# Patient Record
Sex: Male | Born: 1970 | Race: White | Hispanic: No | Marital: Married | Smoking: Never smoker
Health system: Southern US, Community
[De-identification: ages and names within clinical notes are randomized; demographics above are authoritative.]

## PROBLEM LIST (undated history)

## (undated) ENCOUNTER — Ambulatory Visit: Disposition: A | Discharge status: Home / Self Care | Payer: Self-pay

## (undated) DIAGNOSIS — I82409 Acute embolism and thrombosis of unspecified deep veins of unspecified lower extremity: Secondary | ICD-10-CM

## (undated) DIAGNOSIS — R112 Nausea with vomiting, unspecified: Secondary | ICD-10-CM

## (undated) DIAGNOSIS — Z9889 Other specified postprocedural states: Secondary | ICD-10-CM

## (undated) DIAGNOSIS — M1990 Unspecified osteoarthritis, unspecified site: Secondary | ICD-10-CM

## (undated) HISTORY — PX: SHOULDER ARTHROSCOPY: SHX128

## (undated) HISTORY — PX: ROTATOR CUFF REPAIR: SHX139

## (undated) HISTORY — PX: KNEE ARTHROSCOPY: SUR90

## (undated) HISTORY — PX: TYMPANOSTOMY TUBE PLACEMENT: SHX32

---

## 2015-08-11 ENCOUNTER — Ambulatory Visit (HOSPITAL_COMMUNITY): Payer: BLUE CROSS/BLUE SHIELD | Admitting: Anesthesiology

## 2015-08-11 ENCOUNTER — Encounter (HOSPITAL_COMMUNITY)
Admission: AD | Disposition: A | Discharge status: Home Health | Payer: Self-pay | Source: Ambulatory Visit | Attending: Orthopedic Surgery

## 2015-08-11 ENCOUNTER — Inpatient Hospital Stay (HOSPITAL_COMMUNITY)
Admission: AD | Admit: 2015-08-11 | Discharge: 2015-08-16 | DRG: 486 | Disposition: A | Discharge status: Home Health | Payer: BLUE CROSS/BLUE SHIELD | Source: Ambulatory Visit | Attending: Orthopedic Surgery | Admitting: Orthopedic Surgery

## 2015-08-11 ENCOUNTER — Encounter (HOSPITAL_COMMUNITY): Payer: Self-pay | Admitting: General Practice

## 2015-08-11 ENCOUNTER — Other Ambulatory Visit: Payer: Self-pay | Admitting: Orthopedic Surgery

## 2015-08-11 DIAGNOSIS — M25512 Pain in left shoulder: Secondary | ICD-10-CM

## 2015-08-11 DIAGNOSIS — B9561 Methicillin susceptible Staphylococcus aureus infection as the cause of diseases classified elsewhere: Secondary | ICD-10-CM | POA: Diagnosis present

## 2015-08-11 DIAGNOSIS — M25562 Pain in left knee: Secondary | ICD-10-CM | POA: Diagnosis present

## 2015-08-11 DIAGNOSIS — M00062 Staphylococcal arthritis, left knee: Secondary | ICD-10-CM | POA: Diagnosis present

## 2015-08-11 DIAGNOSIS — M659 Synovitis and tenosynovitis, unspecified: Secondary | ICD-10-CM | POA: Diagnosis present

## 2015-08-11 DIAGNOSIS — Z9889 Other specified postprocedural states: Secondary | ICD-10-CM

## 2015-08-11 DIAGNOSIS — M00012 Staphylococcal arthritis, left shoulder: Secondary | ICD-10-CM | POA: Diagnosis present

## 2015-08-11 DIAGNOSIS — M00069 Staphylococcal arthritis, unspecified knee: Secondary | ICD-10-CM | POA: Diagnosis not present

## 2015-08-11 DIAGNOSIS — R7881 Bacteremia: Secondary | ICD-10-CM | POA: Diagnosis not present

## 2015-08-11 DIAGNOSIS — Z959 Presence of cardiac and vascular implant and graft, unspecified: Secondary | ICD-10-CM | POA: Diagnosis not present

## 2015-08-11 DIAGNOSIS — M009 Pyogenic arthritis, unspecified: Secondary | ICD-10-CM | POA: Diagnosis present

## 2015-08-11 HISTORY — PX: FINE NEEDLE ASPIRATION: SHX6590

## 2015-08-11 HISTORY — PX: KNEE ARTHROSCOPY: SHX127

## 2015-08-11 HISTORY — DX: Other specified postprocedural states: R11.2

## 2015-08-11 HISTORY — DX: Other specified postprocedural states: Z98.890

## 2015-08-11 LAB — CBC WITH DIFFERENTIAL/PLATELET
Basophils Absolute: 0 10*3/uL (ref 0.0–0.1)
Basophils Relative: 0 %
EOS PCT: 0 %
Eosinophils Absolute: 0 10*3/uL (ref 0.0–0.7)
HCT: 41.6 % (ref 39.0–52.0)
Hemoglobin: 13.9 g/dL (ref 13.0–17.0)
LYMPHS ABS: 0.7 10*3/uL (ref 0.7–4.0)
LYMPHS PCT: 5 %
MCH: 31.2 pg (ref 26.0–34.0)
MCHC: 33.4 g/dL (ref 30.0–36.0)
MCV: 93.5 fL (ref 78.0–100.0)
Monocytes Absolute: 1.1 10*3/uL — ABNORMAL HIGH (ref 0.1–1.0)
Monocytes Relative: 7 %
NEUTROS ABS: 12.7 10*3/uL — AB (ref 1.7–7.7)
NEUTROS PCT: 88 %
PLATELETS: 490 10*3/uL — AB (ref 150–400)
RBC: 4.45 MIL/uL (ref 4.22–5.81)
RDW: 13 % (ref 11.5–15.5)
WBC: 14.5 10*3/uL — AB (ref 4.0–10.5)

## 2015-08-11 LAB — COMPREHENSIVE METABOLIC PANEL
ALK PHOS: 207 U/L — AB (ref 38–126)
ALT: 68 U/L — AB (ref 17–63)
AST: 53 U/L — ABNORMAL HIGH (ref 15–41)
Albumin: 2.1 g/dL — ABNORMAL LOW (ref 3.5–5.0)
Anion gap: 13 (ref 5–15)
BILIRUBIN TOTAL: 2 mg/dL — AB (ref 0.3–1.2)
BUN: 15 mg/dL (ref 6–20)
CALCIUM: 8.7 mg/dL — AB (ref 8.9–10.3)
CHLORIDE: 97 mmol/L — AB (ref 101–111)
CO2: 27 mmol/L (ref 22–32)
CREATININE: 0.89 mg/dL (ref 0.61–1.24)
Glucose, Bld: 123 mg/dL — ABNORMAL HIGH (ref 65–99)
Potassium: 4.1 mmol/L (ref 3.5–5.1)
Sodium: 137 mmol/L (ref 135–145)
TOTAL PROTEIN: 7.4 g/dL (ref 6.5–8.1)

## 2015-08-11 LAB — SYNOVIAL CELL COUNT + DIFF, W/ CRYSTALS
Crystals, Fluid: NONE SEEN
Eosinophils-Synovial: 0 % (ref 0–1)
Lymphocytes-Synovial Fld: 1 % (ref 0–20)
Monocyte-Macrophage-Synovial Fluid: 1 % — ABNORMAL LOW (ref 50–90)
Neutrophil, Synovial: 98 % — ABNORMAL HIGH (ref 0–25)
WBC, Synovial: 13500 /mm3 — ABNORMAL HIGH (ref 0–200)

## 2015-08-11 LAB — PROTIME-INR
INR: 1.28 (ref 0.00–1.49)
PROTHROMBIN TIME: 16.1 s — AB (ref 11.6–15.2)

## 2015-08-11 LAB — APTT: aPTT: 29 seconds (ref 24–37)

## 2015-08-11 SURGERY — ARTHROSCOPY, KNEE
Anesthesia: General | Site: Shoulder | Laterality: Left

## 2015-08-11 MED ORDER — OXYCODONE HCL 5 MG PO TABS
5.0000 mg | ORAL_TABLET | ORAL | Status: DC | PRN
  Administered 2015-08-11 – 2015-08-16 (×25): 10 mg via ORAL
  Filled 2015-08-11 (×25): qty 2

## 2015-08-11 MED ORDER — DIAZEPAM 5 MG PO TABS
5.0000 mg | ORAL_TABLET | Freq: Four times a day (QID) | ORAL | Status: DC | PRN
  Administered 2015-08-12 – 2015-08-16 (×11): 5 mg via ORAL
  Filled 2015-08-11 (×11): qty 1

## 2015-08-11 MED ORDER — HYDROMORPHONE HCL 1 MG/ML IJ SOLN
INTRAMUSCULAR | Status: AC
  Filled 2015-08-11: qty 1

## 2015-08-11 MED ORDER — ONDANSETRON HCL 4 MG/2ML IJ SOLN
INTRAMUSCULAR | Status: DC | PRN
  Administered 2015-08-11: 4 mg via INTRAVENOUS

## 2015-08-11 MED ORDER — LACTATED RINGERS IV SOLN
INTRAVENOUS | Status: DC
  Administered 2015-08-11 (×2): via INTRAVENOUS

## 2015-08-11 MED ORDER — CEFAZOLIN SODIUM-DEXTROSE 2-3 GM-% IV SOLR
2.0000 g | INTRAVENOUS | Status: AC
  Administered 2015-08-11: 2 g via INTRAVENOUS

## 2015-08-11 MED ORDER — ACETAMINOPHEN 325 MG PO TABS
650.0000 mg | ORAL_TABLET | Freq: Four times a day (QID) | ORAL | Status: DC | PRN
  Administered 2015-08-13 – 2015-08-16 (×6): 650 mg via ORAL
  Filled 2015-08-11 (×7): qty 2

## 2015-08-11 MED ORDER — FENTANYL CITRATE (PF) 250 MCG/5ML IJ SOLN
INTRAMUSCULAR | Status: AC
  Filled 2015-08-11: qty 5

## 2015-08-11 MED ORDER — PROPOFOL 10 MG/ML IV BOLUS
INTRAVENOUS | Status: AC
  Filled 2015-08-11: qty 20

## 2015-08-11 MED ORDER — METOCLOPRAMIDE HCL 5 MG PO TABS: 5.0000 mg | ORAL_TABLET | Freq: Three times a day (TID) | ORAL | Status: DC | PRN

## 2015-08-11 MED ORDER — POLYETHYLENE GLYCOL 3350 17 G PO PACK: 17.0000 g | PACK | Freq: Every day | ORAL | Status: DC | PRN

## 2015-08-11 MED ORDER — LACTATED RINGERS IV SOLN
INTRAVENOUS | Status: DC
  Administered 2015-08-11: 18:00:00 via INTRAVENOUS

## 2015-08-11 MED ORDER — MIDAZOLAM HCL 2 MG/2ML IJ SOLN
INTRAMUSCULAR | Status: AC
  Filled 2015-08-11: qty 4

## 2015-08-11 MED ORDER — LIDOCAINE HCL (CARDIAC) 20 MG/ML IV SOLN
INTRAVENOUS | Status: DC | PRN
  Administered 2015-08-11: 60 mg via INTRAVENOUS

## 2015-08-11 MED ORDER — CEFAZOLIN SODIUM-DEXTROSE 2-3 GM-% IV SOLR
INTRAVENOUS | Status: AC
  Filled 2015-08-11: qty 50

## 2015-08-11 MED ORDER — CEFAZOLIN SODIUM-DEXTROSE 2-3 GM-% IV SOLR
2.0000 g | Freq: Four times a day (QID) | INTRAVENOUS | Status: DC
  Filled 2015-08-11 (×2): qty 50

## 2015-08-11 MED ORDER — ENOXAPARIN SODIUM 40 MG/0.4ML ~~LOC~~ SOLN
40.0000 mg | SUBCUTANEOUS | Status: DC
  Administered 2015-08-12 – 2015-08-16 (×4): 40 mg via SUBCUTANEOUS
  Filled 2015-08-11 (×4): qty 0.4

## 2015-08-11 MED ORDER — MORPHINE SULFATE (PF) 2 MG/ML IV SOLN
INTRAVENOUS | Status: AC
  Filled 2015-08-11: qty 2

## 2015-08-11 MED ORDER — BUPIVACAINE HCL (PF) 0.25 % IJ SOLN
INTRAMUSCULAR | Status: AC
  Filled 2015-08-11: qty 30

## 2015-08-11 MED ORDER — MIDAZOLAM HCL 2 MG/2ML IJ SOLN
INTRAMUSCULAR | Status: AC
Start: 2015-08-11 — End: 2015-08-11
  Filled 2015-08-11: qty 4

## 2015-08-11 MED ORDER — DOCUSATE SODIUM 100 MG PO CAPS
100.0000 mg | ORAL_CAPSULE | Freq: Two times a day (BID) | ORAL | Status: DC
  Administered 2015-08-11 – 2015-08-16 (×8): 100 mg via ORAL
  Filled 2015-08-11 (×8): qty 1

## 2015-08-11 MED ORDER — ACETAMINOPHEN 650 MG RE SUPP: 650.0000 mg | Freq: Four times a day (QID) | RECTAL | Status: DC | PRN

## 2015-08-11 MED ORDER — FENTANYL CITRATE (PF) 100 MCG/2ML IJ SOLN
INTRAMUSCULAR | Status: DC | PRN
  Administered 2015-08-11: 100 ug via INTRAVENOUS
  Administered 2015-08-11 (×7): 50 ug via INTRAVENOUS

## 2015-08-11 MED ORDER — CEFAZOLIN SODIUM-DEXTROSE 2-3 GM-% IV SOLR
2.0000 g | Freq: Three times a day (TID) | INTRAVENOUS | Status: DC
  Administered 2015-08-11 – 2015-08-12 (×3): 2 g via INTRAVENOUS
  Filled 2015-08-11 (×6): qty 50

## 2015-08-11 MED ORDER — MIDAZOLAM HCL 5 MG/5ML IJ SOLN
INTRAMUSCULAR | Status: DC | PRN
  Administered 2015-08-11: 2 mg via INTRAVENOUS

## 2015-08-11 MED ORDER — ONDANSETRON HCL 4 MG/2ML IJ SOLN: 4.0000 mg | Freq: Four times a day (QID) | INTRAMUSCULAR | Status: DC | PRN

## 2015-08-11 MED ORDER — METOCLOPRAMIDE HCL 5 MG/ML IJ SOLN: 5.0000 mg | Freq: Three times a day (TID) | INTRAMUSCULAR | Status: DC | PRN

## 2015-08-11 MED ORDER — HYDROMORPHONE HCL 1 MG/ML IJ SOLN
1.0000 mg | INTRAMUSCULAR | Status: DC | PRN
  Filled 2015-08-11: qty 1

## 2015-08-11 MED ORDER — HYDROMORPHONE HCL 1 MG/ML IJ SOLN
0.2500 mg | INTRAMUSCULAR | Status: DC | PRN
  Administered 2015-08-11: 0.5 mg via INTRAVENOUS

## 2015-08-11 MED ORDER — PROPOFOL 10 MG/ML IV BOLUS
INTRAVENOUS | Status: DC | PRN
  Administered 2015-08-11 (×2): 100 mg via INTRAVENOUS
  Administered 2015-08-11: 200 mg via INTRAVENOUS

## 2015-08-11 MED ORDER — CHLORHEXIDINE GLUCONATE 4 % EX LIQD: 60.0000 mL | Freq: Once | CUTANEOUS | Status: DC

## 2015-08-11 MED ORDER — ONDANSETRON HCL 4 MG PO TABS: 4.0000 mg | ORAL_TABLET | Freq: Four times a day (QID) | ORAL | Status: DC | PRN

## 2015-08-11 MED ORDER — BISACODYL 5 MG PO TBEC
5.0000 mg | DELAYED_RELEASE_TABLET | Freq: Every day | ORAL | Status: DC | PRN
  Administered 2015-08-15: 5 mg via ORAL
  Filled 2015-08-11: qty 1

## 2015-08-11 MED ORDER — SODIUM CHLORIDE 0.9 % IR SOLN
Status: DC | PRN
  Administered 2015-08-11: 6000 mL

## 2015-08-11 SURGICAL SUPPLY — 46 items
BANDAGE ELASTIC 6 VELCRO ST LF (GAUZE/BANDAGES/DRESSINGS) ×4 IMPLANT
BLADE CUDA 5.5 (BLADE) IMPLANT
BLADE CUTTER GATOR 3.5 (BLADE) IMPLANT
BLADE GREAT WHITE 4.2 (BLADE) ×3 IMPLANT
BLADE GREAT WHITE 4.2MM (BLADE) ×1
BOOTCOVER CLEANROOM LRG (PROTECTIVE WEAR) ×16 IMPLANT
CLOSURE WOUND 1/2 X4 (GAUZE/BANDAGES/DRESSINGS) ×1
DRAPE ARTHROSCOPY W/POUCH 114 (DRAPES) ×4 IMPLANT
DRSG PAD ABDOMINAL 8X10 ST (GAUZE/BANDAGES/DRESSINGS) ×4 IMPLANT
DURAPREP 26ML APPLICATOR (WOUND CARE) ×4 IMPLANT
EVACUATOR 1/8 PVC DRAIN (DRAIN) ×4 IMPLANT
GAUZE SPONGE 4X4 12PLY STRL (GAUZE/BANDAGES/DRESSINGS) ×4 IMPLANT
GLOVE BIO SURGEON STRL SZ7 (GLOVE) ×4 IMPLANT
GLOVE BIO SURGEON STRL SZ7.5 (GLOVE) ×4 IMPLANT
GLOVE BIO SURGEON STRL SZ8 (GLOVE) ×4 IMPLANT
GLOVE BIOGEL PI IND STRL 6.5 (GLOVE) ×2 IMPLANT
GLOVE BIOGEL PI IND STRL 7.0 (GLOVE) ×2 IMPLANT
GLOVE BIOGEL PI INDICATOR 6.5 (GLOVE) ×2
GLOVE BIOGEL PI INDICATOR 7.0 (GLOVE) ×2
GLOVE EUDERMIC 7 POWDERFREE (GLOVE) ×4 IMPLANT
GLOVE SS BIOGEL STRL SZ 7.5 (GLOVE) ×2 IMPLANT
GLOVE SUPERSENSE BIOGEL SZ 7.5 (GLOVE) ×2
GLOVE SURG SS PI 7.0 STRL IVOR (GLOVE) ×4 IMPLANT
GOWN STRL REUS W/ TWL LRG LVL3 (GOWN DISPOSABLE) ×2 IMPLANT
GOWN STRL REUS W/ TWL XL LVL3 (GOWN DISPOSABLE) ×6 IMPLANT
GOWN STRL REUS W/TWL LRG LVL3 (GOWN DISPOSABLE) ×2
GOWN STRL REUS W/TWL XL LVL3 (GOWN DISPOSABLE) ×6
KIT BASIN OR (CUSTOM PROCEDURE TRAY) ×4 IMPLANT
KIT ROOM TURNOVER OR (KITS) IMPLANT
MANIFOLD NEPTUNE II (INSTRUMENTS) ×4 IMPLANT
NEEDLE 18GX1X1/2 (RX/OR ONLY) (NEEDLE) ×4 IMPLANT
PACK ARTHROSCOPY DSU (CUSTOM PROCEDURE TRAY) ×4 IMPLANT
PAD ARMBOARD 7.5X6 YLW CONV (MISCELLANEOUS) ×8 IMPLANT
PADDING CAST ABS 6INX4YD NS (CAST SUPPLIES) ×2
PADDING CAST ABS COTTON 6X4 NS (CAST SUPPLIES) ×2 IMPLANT
PADDING CAST COTTON 6X4 STRL (CAST SUPPLIES) ×4 IMPLANT
RING FOAM WHITE (MISCELLANEOUS) ×4 IMPLANT
SET ARTHROSCOPY TUBING (MISCELLANEOUS) ×2
SET ARTHROSCOPY TUBING LN (MISCELLANEOUS) ×2 IMPLANT
SPONGE GAUZE 4X4 12PLY STER LF (GAUZE/BANDAGES/DRESSINGS) ×4 IMPLANT
SPONGE LAP 4X18 X RAY DECT (DISPOSABLE) ×4 IMPLANT
STRIP CLOSURE SKIN 1/2X4 (GAUZE/BANDAGES/DRESSINGS) ×3 IMPLANT
SUT ETHILON 3 0 PS 1 (SUTURE) ×4 IMPLANT
SYR 30ML LL (SYRINGE) ×4 IMPLANT
TOWEL OR 17X24 6PK STRL BLUE (TOWEL DISPOSABLE) ×4 IMPLANT
WATER STERILE IRR 1000ML POUR (IV SOLUTION) ×4 IMPLANT

## 2015-08-11 NOTE — Transfer of Care (Signed)
Immediate Anesthesia Transfer of Care Note  Patient: Raymond CraverJeremy D Peterson  Procedure(s) Performed: Procedure(s): Left Knee Arthroscopic Lavage (Left) LEFT SHOULDER ASPIRATION (Left)  Patient Location: PACU  Anesthesia Type:General  Level of Consciousness: awake, alert , oriented and patient cooperative  Airway & Oxygen Therapy: Patient Spontanous Breathing and Patient connected to nasal cannula oxygen  Post-op Assessment: Report given to RN and Post -op Vital signs reviewed and stable  Post vital signs: Reviewed and stable  Last Vitals:  Filed Vitals:   08/11/15 0848  BP: 164/73  Pulse: 109  Temp: 37.1 C  Resp: 16    Complications: No apparent anesthesia complications

## 2015-08-11 NOTE — Op Note (Signed)
08/11/2015  3:12 PM  PATIENT:   Osa CraverJeremy D Zeringue  44 y.o. male  PRE-OPERATIVE DIAGNOSIS:  Infected left Knee  POST-OPERATIVE DIAGNOSIS:  Same with possible left shoulder infection  PROCEDURE:  Arthroscopic synovectomy and lavage left knee, aspiration left shoulder  SURGEON:  Rustyn Conery, Vania ReaKevin M. M.D.  ASSISTANTS: Shuford pac   ANESTHESIA:   GET   EBL: min  SPECIMEN:  Left shoulder aspirate for cell count, culture and sensitivity  Drains: medium hemovac left knee x1   PATIENT DISPOSITION:  PACU - hemodynamically stable.    PLAN OF CARE: Admit to inpatient   Dictation# [id number]   Contact # [phone]

## 2015-08-11 NOTE — Progress Notes (Signed)
ANTIBIOTIC CONSULT NOTE - INITIAL  Pharmacy Consult for Cefazolin Indication: septic arthritis  No Known Allergies Patient Measurements: Height: 5\' 11"  (180.3 cm) Weight: 255 lb (115.667 kg) IBW/kg (Calculated) : 75.3 Vital Signs: Temp: 98.5 F (36.9 C) (11/03 1742) Temp Source: Oral (11/03 1742) BP: 133/76 mmHg (11/03 1742) Pulse Rate: 104 (11/03 1742) Intake/Output from this shift: Total I/O In: 1040 [P.O.:240; I.V.:800] Out: 540 [Urine:500; Blood:40]  Labs:  Recent Labs  08/11/15 0853  WBC 14.5*  HGB 13.9  PLT 490*  CREATININE 0.89   Estimated Creatinine Clearance: 137.1 mL/min (by C-G formula based on Cr of 0.89). No results for input(s): VANCOTROUGH, VANCOPEAK, VANCORANDOM, GENTTROUGH, GENTPEAK, GENTRANDOM, TOBRATROUGH, TOBRAPEAK, TOBRARND, AMIKACINPEAK, AMIKACINTROU, AMIKACIN in the last 72 hours.   Microbiology: Recent Results (from the past 720 hour(s))  Body fluid culture     Status: None (Preliminary result)   Collection Time: 08/11/15  4:24 PM  Result Value Ref Range Status   Specimen Description SYNOVIAL LEFT SHOULDER  Final   Special Requests SPECIMEN A PT ON ANCEF  Final   Gram Stain PENDING  Incomplete   Culture PENDING  Incomplete   Report Status PENDING  Incomplete    Medical History: Past Medical History  Diagnosis Date  . PONV (postoperative nausea and vomiting)     "just once"  . Medical history non-contributory     Medications:  Anti-infectives    Start     Dose/Rate Route Frequency Ordered Stop   08/11/15 2030  ceFAZolin (ANCEF) IVPB 2 g/50 mL premix     2 g 100 mL/hr over 30 Minutes Intravenous Every 6 hours 08/11/15 1720 08/12/15 1429   08/11/15 0848  ceFAZolin (ANCEF) 2-3 GM-% IVPB SOLR    Comments:  Forte, Lindsi   : cabinet override      08/11/15 0848 08/11/15 2059   08/11/15 0845  ceFAZolin (ANCEF) IVPB 2 g/50 mL premix     2 g 100 mL/hr over 30 Minutes Intravenous On call to O.R. 08/11/15 0841 08/11/15 1431      Assessment: 44 year old obese male with septic arthritis of native joint to continue on cefazolin per pharmacy dosing. Plan per ID for 2 weeks of IV therapy followed by 2 weeks of oral. WBC is elevated and Tmax is 100.6. SCr is 0.89 with estimated CrCl >100 mL/min. Patient is making good urine. Culture results are pending.  Goal of Therapy:  Clinical resolution of infection  Plan:  Adjust Ancef 2g IV every 8 hours.  Monitor clinical status, culture results, and renal function.    Link SnufferJessica Tram Wrenn, PharmD, BCPS Clinical Pharmacist [phone] 08/11/2015,6:46 PM

## 2015-08-11 NOTE — Consult Note (Addendum)
[place]       Reason for Consult: septic arthritis of native joint    Referring Physician: Dr. Onnie Graham  Active Problems:   Septic joint of left knee joint ([place])   . ceFAZolin      .  ceFAZolin (ANCEF) IV  2 g Intravenous Q6H  . docusate sodium  100 mg Oral BID  . [START ON 08/12/2015] enoxaparin (LOVENOX) injection  40 mg Subcutaneous Q24H  . HYDROmorphone        Recommendations: Continue with cefazolin  I will order for a picc line 2 weeks of IV antibiotics followed by oral therapy about 2 weeks  I will check [facility] for sensitivities tomorrow Routine screening for HIV CRP, ESR Will check hepatitis C  Assessment: He has septic arthritis of native joint of left knee  Possible shoulder infection.   Also with low albumin, elevated Alk Phos, LFTs  Antibiotics: cefazolin  HPI: Raymond Peterson is a 44 y.o. male with multiple joint injuries in the past who had about 1 week of progressively worsening knee pain, no associated fever or chills. Works lifting heavy objects.  No trauma though associated with pain.  Was aspirated in clinic yesterday and > 100,000 cells and growing Staph aureus.  Also with left shoulder pain and aspirated today in OR with 13,500 WBCs, 98% neutrophils.    Review of Systems:  Constitutional: negative for fevers, chills and malaise Gastrointestinal: negative for diarrhea All other systems reviewed and are negative   Past Medical History  Diagnosis Date  . PONV (postoperative nausea and vomiting)     "just once"  . Medical history non-contributory     Social History  Substance Use Topics  . Smoking status: Never Smoker   . Smokeless tobacco: None  . Alcohol Use: No    History reviewed. No pertinent family history.  No Known Allergies  Physical Exam: Constitutional: in no apparent distress and alert  Filed Vitals:   08/11/15 1742  BP: 133/76  Pulse: 104  Temp: 98.5 F (36.9 C)  Resp: 19   EYES:  anicteric ENMT: No thrush Cardiovascular: Cor RRR and No murmurs Respiratory: clear; GI: Bowel sounds are normal, liver is not enlarged Musculoskeletal: peripheral pulses normal, no pedal edema, no clubbing or cyanosis Skin: negatives: no rash Hematologic: no cervical lad  Lab Results  Component Value Date   WBC 14.5* 08/11/2015   HGB 13.9 08/11/2015   HCT 41.6 08/11/2015   MCV 93.5 08/11/2015   PLT 490* 08/11/2015    Lab Results  Component Value Date   CREATININE 0.89 08/11/2015   BUN 15 08/11/2015   NA 137 08/11/2015   K 4.1 08/11/2015   CL 97* 08/11/2015   CO2 27 08/11/2015    Lab Results  Component Value Date   ALT 68* 08/11/2015   AST 53* 08/11/2015   ALKPHOS 207* 08/11/2015     Microbiology: Recent Results (from the past 240 hour(s))  Body fluid culture     Status: None (Preliminary result)   Collection Time: 08/11/15  4:24 PM  Result Value Ref Range Status   Specimen Description SYNOVIAL LEFT SHOULDER  Final   Special Requests SPECIMEN A PT ON ANCEF  Final   Gram Stain PENDING  Incomplete   Culture PENDING  Incomplete   Report Status PENDING  Incomplete    Ceanna Wareing, [name], MD [email or web address] [phone] pager  [phone] cell 08/11/2015, 6:26 PM

## 2015-08-11 NOTE — Anesthesia Preprocedure Evaluation (Addendum)
Anesthesia Evaluation  Patient identified by MRN, date of birth, ID band Patient awake    Reviewed: Allergy & Precautions, H&P , NPO status , Patient's Chart, lab work & pertinent test results  History of Anesthesia Complications (+) PONV  Airway Mallampati: II  TM Distance: >3 FB Neck ROM: Full    Dental no notable dental hx. (+) Teeth Intact, Dental Advisory Given   Pulmonary neg pulmonary ROS,    Pulmonary exam normal breath sounds clear to auscultation       Cardiovascular negative cardio ROS   Rhythm:Regular Rate:Normal     Neuro/Psych negative neurological ROS  negative psych ROS   GI/Hepatic negative GI ROS, Neg liver ROS,   Endo/Other  negative endocrine ROS  Renal/GU negative Renal ROS  negative genitourinary   Musculoskeletal   Abdominal   Peds  Hematology negative hematology ROS (+)   Anesthesia Other Findings   Reproductive/Obstetrics negative OB ROS                            Anesthesia Physical Anesthesia Plan  ASA: II  Anesthesia Plan: General   Post-op Pain Management:    Induction: Intravenous  Airway Management Planned: LMA  Additional Equipment:   Intra-op Plan:   Post-operative Plan: Extubation in OR  Informed Consent: I have reviewed the patients History and Physical, chart, labs and discussed the procedure including the risks, benefits and alternatives for the proposed anesthesia with the patient or authorized representative who has indicated his/her understanding and acceptance.   Dental advisory given  Plan Discussed with: CRNA  Anesthesia Plan Comments:         Anesthesia Quick Evaluation

## 2015-08-11 NOTE — Anesthesia Procedure Notes (Signed)
Procedure Name: LMA Insertion Date/Time: 08/11/2015 2:24 PM Performed by: Fransisca KaufmannMEYER, Lilou Kneip E Pre-anesthesia Checklist: Patient identified, Emergency Drugs available, Suction available, Patient being monitored and Timeout performed Patient Re-evaluated:Patient Re-evaluated prior to inductionOxygen Delivery Method: Circle system utilized Preoxygenation: Pre-oxygenation with 100% oxygen Intubation Type: IV induction Ventilation: Mask ventilation without difficulty LMA: LMA inserted LMA Size: 5.0 Number of attempts: 1 Placement Confirmation: positive ETCO2 and breath sounds checked- equal and bilateral Tube secured with: Tape Dental Injury: Teeth and Oropharynx as per pre-operative assessment

## 2015-08-11 NOTE — Anesthesia Postprocedure Evaluation (Signed)
  Anesthesia Post-op Note  Patient: Raymond Peterson  Procedure(s) Performed: Procedure(s): Left Knee Arthroscopic Lavage (Left) LEFT SHOULDER ASPIRATION (Left)  Patient Location: PACU  Anesthesia Type:General  Level of Consciousness: awake and alert   Airway and Oxygen Therapy: Patient Spontanous Breathing  Post-op Pain: Controlled  Post-op Assessment: Post-op Vital signs reviewed, Patient's Cardiovascular Status Stable and Respiratory Function Stable  Post-op Vital Signs: Reviewed  Filed Vitals:   08/11/15 1700  BP:   Pulse: 111  Temp: 37.2 C  Resp: 27    Complications: No apparent anesthesia complications

## 2015-08-11 NOTE — H&P (Signed)
Raymond CraverJeremy D Peterson    Chief Complaint: Infected Knee HPI: The patient is a 44 y.o. male with 2 weeks of progressively increasing left knee pain and swelling, denies fevers or chills, no trauma  Past Medical History  Diagnosis Date  . PONV (postoperative nausea and vomiting)     "just once"  . Medical history non-contributory     Past Surgical History  Procedure Laterality Date  . Rotator cuff repair Left   . Knee arthroscopy Right   . Shoulder arthroscopy Right     History reviewed. No pertinent family history.  Social History:  reports that he has never smoked. He does not have any smokeless tobacco history on file. He reports that he does not drink alcohol or use illicit drugs.  Allergies: No Known Allergies  No prescriptions prior to admission     Physical Exam: left knee exam in office yesterday evening with tense effusion, 150cc purulent synovial fluid removed, wbc>100K  Vitals  Temp:  [98.8 F (37.1 C)] 98.8 F (37.1 C) (11/03 0848) Pulse Rate:  [109] 109 (11/03 0848) Resp:  [16] 16 (11/03 0848) BP: (164)/(73) 164/73 mmHg (11/03 0848) SpO2:  [93 %] 93 % (11/03 0848) Weight:  [115.667 kg (255 lb)] 115.667 kg (255 lb) (11/03 0848)  Assessment/Plan  Impression: Infected Knee  Plan of Action: Procedure(s): Left Knee Arthroscopic Lavage  Raymond Peterson M Raymond Peterson 08/11/2015, 10:27 AM Contact # [phone]

## 2015-08-12 ENCOUNTER — Encounter (HOSPITAL_COMMUNITY): Payer: Self-pay | Admitting: Orthopedic Surgery

## 2015-08-12 ENCOUNTER — Inpatient Hospital Stay (HOSPITAL_COMMUNITY): Payer: BLUE CROSS/BLUE SHIELD

## 2015-08-12 DIAGNOSIS — Z959 Presence of cardiac and vascular implant and graft, unspecified: Secondary | ICD-10-CM

## 2015-08-12 DIAGNOSIS — M00012 Staphylococcal arthritis, left shoulder: Secondary | ICD-10-CM

## 2015-08-12 LAB — CBC WITH DIFFERENTIAL/PLATELET
BASOS ABS: 0 10*3/uL (ref 0.0–0.1)
BASOS PCT: 0 %
EOS ABS: 0.1 10*3/uL (ref 0.0–0.7)
EOS PCT: 1 %
HEMATOCRIT: 37 % — AB (ref 39.0–52.0)
Hemoglobin: 12 g/dL — ABNORMAL LOW (ref 13.0–17.0)
Lymphocytes Relative: 8 %
Lymphs Abs: 0.9 10*3/uL (ref 0.7–4.0)
MCH: 30.6 pg (ref 26.0–34.0)
MCHC: 32.4 g/dL (ref 30.0–36.0)
MCV: 94.4 fL (ref 78.0–100.0)
MONO ABS: 0.8 10*3/uL (ref 0.1–1.0)
Monocytes Relative: 7 %
NEUTROS ABS: 9.3 10*3/uL — AB (ref 1.7–7.7)
Neutrophils Relative %: 84 %
PLATELETS: 506 10*3/uL — AB (ref 150–400)
RBC: 3.92 MIL/uL — ABNORMAL LOW (ref 4.22–5.81)
RDW: 13.2 % (ref 11.5–15.5)
WBC: 11 10*3/uL — ABNORMAL HIGH (ref 4.0–10.5)

## 2015-08-12 LAB — COMPREHENSIVE METABOLIC PANEL
ALT: 54 U/L (ref 17–63)
ANION GAP: 9 (ref 5–15)
AST: 50 U/L — ABNORMAL HIGH (ref 15–41)
Albumin: 1.7 g/dL — ABNORMAL LOW (ref 3.5–5.0)
Alkaline Phosphatase: 183 U/L — ABNORMAL HIGH (ref 38–126)
BILIRUBIN TOTAL: 0.9 mg/dL (ref 0.3–1.2)
BUN: 11 mg/dL (ref 6–20)
CHLORIDE: 96 mmol/L — AB (ref 101–111)
CO2: 30 mmol/L (ref 22–32)
Calcium: 8.2 mg/dL — ABNORMAL LOW (ref 8.9–10.3)
Creatinine, Ser: 0.88 mg/dL (ref 0.61–1.24)
Glucose, Bld: 142 mg/dL — ABNORMAL HIGH (ref 65–99)
POTASSIUM: 3.9 mmol/L (ref 3.5–5.1)
Sodium: 135 mmol/L (ref 135–145)
TOTAL PROTEIN: 6.3 g/dL — AB (ref 6.5–8.1)

## 2015-08-12 LAB — SEDIMENTATION RATE: SED RATE: 112 mm/h — AB (ref 0–16)

## 2015-08-12 LAB — HIV ANTIBODY (ROUTINE TESTING W REFLEX): HIV SCREEN 4TH GENERATION: NONREACTIVE

## 2015-08-12 LAB — C-REACTIVE PROTEIN: CRP: 25.2 mg/dL — AB (ref <1.0)

## 2015-08-12 MED ORDER — SODIUM CHLORIDE 0.9 % IJ SOLN
10.0000 mL | INTRAMUSCULAR | Status: DC | PRN
  Administered 2015-08-13 – 2015-08-16 (×2): 10 mL
  Filled 2015-08-12 (×2): qty 40

## 2015-08-12 MED ORDER — HYDROMORPHONE HCL 1 MG/ML IJ SOLN
1.0000 mg | INTRAMUSCULAR | Status: DC | PRN
  Administered 2015-08-14: 1 mg via INTRAVENOUS
  Filled 2015-08-12: qty 1

## 2015-08-12 NOTE — Progress Notes (Signed)
Lab called with critical value of body synovial shoulder fluid positive for gram positive cocci growth. PA French Anaracy called and notified. No new orders received; will continue to closely monitor pt. Raymond MerlesP. Amo Fartun Paradiso RN.

## 2015-08-12 NOTE — Progress Notes (Signed)
[facility]  Patient Status: New pt for [facility] this admission  [facility] is providing the following services: HHRN and Home Infusion Pharmacy team for home IV ABX. Met with pt and wife tonight to discuss POC for home IV ABX.  Wife will be primary caregiver and support IV ABX administration at home.  [facility] will need IV ABX script if pt does DC over the weekend.   If patient discharges after hours, please call [phone].   Larry Sierras 08/12/2015, 5:09 PM

## 2015-08-12 NOTE — Progress Notes (Signed)
Received call from nurse on floor that lab called and his left shoulder aspirate was growing out gram + cocci. I called and confirmed in lab.  I discussed with Mr. Raymond Peterson our plan is now to perform arthroscopic lavage in AM Will make NPO after midnight and continue current antibiotics. Orders for OP permit placed

## 2015-08-12 NOTE — Progress Notes (Addendum)
[facility]   Reason for visit: Follow up on septic arthritis  Interval History: no new issues, culture from outpatient aspiration is MSSA.  Is on cefazolin.  No fever.  Culture from shoulder ngtd and cells < 50,000.   Physical Exam: Constitutional:  Filed Vitals:   08/12/15 1338  BP: 146/86  Pulse: 101  Temp: 98.6 F (37 C)  Resp: 22   patient appears in NAD Eyes: anicteric HENT: no thrush Respiratory: Normal respiratory effort; CTA B Cardiovascular: RRR  Review of Systems: Constitutional: negative for fevers and chills Gastrointestinal: negative for diarrhea  Lab Results  Component Value Date   WBC 11.0* 08/12/2015   HGB 12.0* 08/12/2015   HCT 37.0* 08/12/2015   MCV 94.4 08/12/2015   PLT 506* 08/12/2015    Lab Results  Component Value Date   CREATININE 0.88 08/12/2015   BUN 11 08/12/2015   NA 135 08/12/2015   K 3.9 08/12/2015   CL 96* 08/12/2015   CO2 30 08/12/2015    Lab Results  Component Value Date   ALT 54 08/12/2015   AST 50* 08/12/2015   ALKPHOS 183* 08/12/2015     Microbiology: Recent Results (from the past 240 hour(s))  Body fluid culture     Status: None (Preliminary result)   Collection Time: 08/11/15  4:24 PM  Result Value Ref Range Status   Specimen Description SYNOVIAL LEFT SHOULDER  Final   Special Requests SPECIMEN A PT ON ANCEF  Final   Gram Stain   Final    ABUNDANT WBC PRESENT, PREDOMINANTLY PMN NO ORGANISMS SEEN    Culture   Final    CULTURE REINCUBATED FOR BETTER GROWTH CRITICAL RESULT CALLED TO, READ BACK BY AND VERIFIED WITH: P QUSSOUR 08/12/15 @ 1424 M VESTAL    Report Status PENDING  Incomplete    Impression:  1. Septic arthritis with MSSA 2. Inflammatory shoulder joint  Plan: 1. At lest 2 weeks of IV cefazolin for 2 weeks through 11/17 followed by oral therapy about 2 weeks if improving.  If CRP, ESR remain elevated will consider extended IV cefazolin.  2. Cbc, cmp, CRP and ESR after one  week per home health 3. Not to pull picc until seen by ID 4. Antibiotics per home health protocol  Dr. Campbell will monitor culture over the weekend in shoulder, otherwise we will follow up with him in our office prior to treatment completion.   

## 2015-08-12 NOTE — Op Note (Signed)
NAMELAYTH, CEREZO NO.:  [id number]  MEDICAL RECORD NO.:  [id number]  LOCATION:  [id number]                        FACILITY:  [facility]  PHYSICIAN:  Vania Rea. Vi Whitesel, M.D.  DATE OF BIRTH:  11/10/1970  DATE OF PROCEDURE:  08/11/2015 DATE OF DISCHARGE:                              OPERATIVE REPORT   PREOPERATIVE DIAGNOSIS:  Left knee septic arthritis.  POSTOPERATIVE DIAGNOSES: 1. Left knee septic arthritis. 2. Possible left shoulder septic arthritis.  PROCEDURE: 1. Left knee arthroscopic lavage and extensive synovectomy. 2. Aspiration of left shoulder.  SURGEON:  Vania Rea. Carlina Derks, M.D.  Threasa HeadsFrench Ana A. Shuford, PA-C.  ANESTHESIA:  General endotracheal.  ESTIMATED BLOOD LOSS:  Minimal.  DRAINS:  Hemovac in the left knee x1.  SPECIMENS:  The left shoulder were aspirated obtaining 3 mL of bloody synovial fluid, which was sent for cell count, routine culture, and sensitivity.  Hemovac drain was left in the left knee x1.  HISTORY:  Mr. Bozzo is a 44 year old gentleman, who presented to our office yesterday initially for evaluation of his left shoulder having sustained an injury 3 weeks ago while at work and as he pulled on an object felt and heard a "pop" in left shoulder with complaints of pain. This was a worker's compensation injury and has been scheduled to see [place] for his left shoulder in our office yesterday.  On presentation, however, he also reports that for the last approximately 10-14 days, his left knee had become increasingly painful and swollen.  Denies any fevers or chills.  No recent systemic or febrile illnesses.  In general, he is in overall good health.  Does report that he has a history of left knee "arthritis."  Examination in our office yesterday showed a tense effusion about the left knee, but there is no obvious cellulitis, erythema, or induration.  No obvious ascending lymphangitis.  I had performed an aspiration of the left knee in  the office, which revealed 150 mL of murky yellow synovial fluid, which we thought could potentially represent an acute gouty arthritis but certainly there was concern that it may represent a bacterial infection and so we sent this off for stat Gram stain, culture, sensitivity, cell count, and crystals. He was given instructions to remain n.p.o. after midnight as we waited for the results of these studies and we did confirm last night that there were no evidence for crystals, greater than 100,000 white blood cells.  Given these findings, we made arrangements for urgent arthroscopic lavage of left knee.  [facility] comes into the [facility] today for arthroscopic lavage and synovectomy of his left knee.  Preoperatively, I counseled Mr. Nabor regarding treatment options, potential risks versus benefits thereof.  Possible surgical complications were all reviewed including bleeding, persistent infection, and possible need for additional surgery.  He understands and accepts and agrees to plan.  PROCEDURE IN DETAIL:  After undergoing routine preop evaluation, the patient was started on antibiotics of 2 g Ancef.  Brought to the operating room, placed supine on the operating table, underwent smooth induction of a general endotracheal anesthesia.  Left leg was placed in the leg holder and sterilely prepped and draped in  standard fashion. Time-out was called.  Standard arthroscopy portals were established and abundant cloudy yellow synovial fluid was evacuated.  At this point, the arthroscopic instruments were inserted, the cannula confirmed diffuse synovitis and collection of purulent material.  We utilized arthroscopic shaver to perform an aggressive subtotal synovectomy removing all necrotic and purulent-appearing materials.  He did have advanced knee joint arthritis with significant chondral damage in the patellofemoral joint and exposed subchondral bone in the medial compartment  and multiple loose cartilage flaps.  We performed aggressive debridement of all nonviable and degenerative tissues and extensive synovectomy and ultimately irrigated with 6 L of fluid through the knee.  At the completion, we were quite satisfied with the level of synovectomy and debridement.  Fluid and instruments were removed.  We placed a Hemovac drain exiting through the superomedial portal.  Bulky dry dressing was applied after the portals had been closed with nylon suture.  Knee retractors, bulky dry dressing wrapped in Ace bandage, thigh-high support stocking.  At this point, it was also concerning given the findings in his knee that there may potentially be an additional side of aseptic process in the left shoulder.  At the completion of the case, we went ahead and prepped the left shoulder, performed an aspiration with an 18-gauge needle and obtained approximately 3 to 4 mL of bloody synovial fluid. There was no gross purulence and this was sent off for cell count, culture and sensitivity, and we will base additional regarding his left shoulder on the culture findings on the left shoulder aspirate.  At this point, the patient was awakened, extubated, and taken to the recovery room in stable condition.  Tracy A. Shuford, PA-C was used as an Geophysicist/field seismologistassistant throughout this case and essential for help with positioning the patient, positioning the extremity, management of the arthroscopic equipment, wound closure, and intraoperative decision making.     Vania ReaKevin M. Nikolette Reindl, M.D.     KMS/MEDQ  D:  08/11/2015  T:  08/12/2015  Job:  [id number]

## 2015-08-12 NOTE — Anesthesia Preprocedure Evaluation (Addendum)
Anesthesia Evaluation  Patient identified by MRN, date of birth, ID band Patient awake    Reviewed: Allergy & Precautions, H&P , NPO status , Patient's Chart, lab work & pertinent test results  History of Anesthesia Complications (+) PONV  Airway Mallampati: II  TM Distance: >3 FB Neck ROM: Full    Dental no notable dental hx. (+) Teeth Intact, Dental Advisory Given   Pulmonary neg pulmonary ROS,    Pulmonary exam normal breath sounds clear to auscultation       Cardiovascular negative cardio ROS   Rhythm:Regular Rate:Normal     Neuro/Psych negative neurological ROS  negative psych ROS   GI/Hepatic negative GI ROS, Neg liver ROS,   Endo/Other  negative endocrine ROS  Renal/GU negative Renal ROS     Musculoskeletal  (+) Arthritis ,   Abdominal   Peds  Hematology negative hematology ROS (+)   Anesthesia Other Findings   Reproductive/Obstetrics negative OB ROS                             Anesthesia Physical  Anesthesia Plan  ASA: II  Anesthesia Plan: General   Post-op Pain Management:    Induction: Intravenous  Airway Management Planned: Oral ETT  Additional Equipment:   Intra-op Plan:   Post-operative Plan: Extubation in OR  Informed Consent: I have reviewed the patients History and Physical, chart, labs and discussed the procedure including the risks, benefits and alternatives for the proposed anesthesia with the patient or authorized representative who has indicated his/her understanding and acceptance.   Dental advisory given  Plan Discussed with: CRNA  Anesthesia Plan Comments:        Anesthesia Quick Evaluation

## 2015-08-12 NOTE — Progress Notes (Signed)
Osa CraverJeremy D Footman  MRN: [id number] DOB/Age: [birth year] 44 y.o. Physician: Jacquelyne BalintKevin Supple,MD Procedure: Procedure(s) (LRB): Left Knee Arthroscopic Lavage (Left) LEFT SHOULDER ASPIRATION (Left)     Subjective: Pain under better control but still wants to just get some sleep. Just had PICC line placed  Vital Signs Temp:  [98.5 F (36.9 C)-100.6 F (38.1 C)] 98.7 F (37.1 C) (11/04 0515) Pulse Rate:  [98-117] 98 (11/04 0515) Resp:  [18-40] 18 (11/04 0515) BP: (125-163)/(70-100) 139/70 mmHg (11/04 0515) SpO2:  [93 %-98 %] 98 % (11/04 0515)  Lab Results  Recent Labs  08/11/15 0853 08/12/15 0337  WBC 14.5* 11.0*  HGB 13.9 12.0*  HCT 41.6 37.0*  PLT 490* 506*   BMET  Recent Labs  08/11/15 0853 08/12/15 0337  NA 137 135  K 4.1 3.9  CL 97* 96*  CO2 27 30  GLUCOSE 123* 142*  BUN 15 11  CREATININE 0.89 0.88  CALCIUM 8.7* 8.2*   INR  Date Value Ref Range Status  08/11/2015 1.28 0.00 - 1.49 Final     Exam hemovac drain patent with just bloody drainage. NVI   Shoulder aspirate yesterday13,500 looking more inflammatory than infectious, cultures pending        Plan Await cultures from knee and ID consult to determine antibiotic dosing and regimen Keep hemovac until tomorrow, it is NOT sewn in Encouraged ROM to knee  Jamae Tison for Dr.Kevin Supple 08/12/2015, 8:55 AM Contact # [phone]

## 2015-08-12 NOTE — Progress Notes (Signed)
Peripherally Inserted Central Catheter/Midline Placement  The IV Nurse has discussed with the patient and/or persons authorized to consent for the patient, the purpose of this procedure and the potential benefits and risks involved with this procedure.  The benefits include less needle sticks, lab draws from the catheter and patient may be discharged home with the catheter.  Risks include, but not limited to, infection, bleeding, blood clot (thrombus formation), and puncture of an artery; nerve damage and irregular heat beat.  Alternatives to this procedure were also discussed.  PICC/Midline Placement Documentation        Raymond Peterson, Raymond Peterson 08/12/2015, 8:03 AM Consent obtained by Reginia FortsMarilyn Lumban, RN

## 2015-08-12 NOTE — Care Management (Signed)
Utilization review completed. Sanyah Molnar, RN Case Manager [phone]. 

## 2015-08-12 NOTE — Care Management Note (Signed)
Case Management Note  Patient Details  Name: Raymond Peterson MRN: [id number] Date of Birth: 1970/12/02  Subjective/Objective:    [age]yr old male admitted with septic left knee and left shoulder, patient underwent aspiration of left knee and shoulder. PICC line has been placed for home  IV antibiotics .             Action/Plan:  Case manager spoke with patient at bedside concerning need for home health RN for IV antibiotics. Choice was offered. Referral was called [facility]. CM left a voice message. Also contacted Jeri ModenaPam Chandler, [facility] IV infusion coordinator, informed her of patient's need. Patient states wife is available to assist at discharge. Case manager will continue to monitor.     Expected Discharge Date:    08/13/15              Expected Discharge Plan:  Home w Home Health Services  In-House Referral:     Discharge planning Services  CM Consult  Post Acute Care Choice:  Home Health, Durable Medical Equipment Choice offered to:     DME Arranged:  IV pump/equipment DME Agency:  [facility]  HH Arranged:  RN, IV Antibiotics HH Agency:  [facility]  Status of Service:  In process, will continue to follow  Medicare Important Message Given:    Date Medicare IM Given:    Medicare IM give by:    Date Additional Medicare IM Given:    Additional Medicare Important Message give by:     If discussed at Long Length of Stay Meetings, dates discussed:    Additional Comments:  Durenda GuthrieBrady, Raymond Meiklejohn Naomi, RN 08/12/2015, 11:20 AM

## 2015-08-13 ENCOUNTER — Inpatient Hospital Stay (HOSPITAL_COMMUNITY): Payer: BLUE CROSS/BLUE SHIELD | Admitting: Anesthesiology

## 2015-08-13 ENCOUNTER — Encounter (HOSPITAL_COMMUNITY)
Admission: AD | Disposition: A | Discharge status: Home Health | Payer: Self-pay | Source: Ambulatory Visit | Attending: Orthopedic Surgery

## 2015-08-13 HISTORY — PX: SHOULDER ARTHROSCOPY: SHX128

## 2015-08-13 LAB — CBC WITH DIFFERENTIAL/PLATELET
Basophils Absolute: 0 10*3/uL (ref 0.0–0.1)
Basophils Relative: 0 %
Eosinophils Absolute: 0.1 10*3/uL (ref 0.0–0.7)
Eosinophils Relative: 1 %
HEMATOCRIT: 36.4 % — AB (ref 39.0–52.0)
Hemoglobin: 12.2 g/dL — ABNORMAL LOW (ref 13.0–17.0)
LYMPHS PCT: 8 %
Lymphs Abs: 0.7 10*3/uL (ref 0.7–4.0)
MCH: 31.4 pg (ref 26.0–34.0)
MCHC: 33.5 g/dL (ref 30.0–36.0)
MCV: 93.8 fL (ref 78.0–100.0)
MONO ABS: 0.8 10*3/uL (ref 0.1–1.0)
MONOS PCT: 8 %
NEUTROS ABS: 8.3 10*3/uL — AB (ref 1.7–7.7)
Neutrophils Relative %: 84 %
Platelets: 518 10*3/uL — ABNORMAL HIGH (ref 150–400)
RBC: 3.88 MIL/uL — ABNORMAL LOW (ref 4.22–5.81)
RDW: 13.2 % (ref 11.5–15.5)
WBC: 9.9 10*3/uL (ref 4.0–10.5)

## 2015-08-13 LAB — SURGICAL PCR SCREEN
MRSA, PCR: NEGATIVE
Staphylococcus aureus: POSITIVE — AB

## 2015-08-13 LAB — HEPATITIS C ANTIBODY (REFLEX): HCV Ab: <0.1 s/co ratio (ref 0.0–0.9)

## 2015-08-13 LAB — HCV COMMENT:

## 2015-08-13 SURGERY — ARTHROSCOPY, SHOULDER
Anesthesia: General | Site: Shoulder | Laterality: Left

## 2015-08-13 MED ORDER — ROCURONIUM BROMIDE 50 MG/5ML IV SOLN
INTRAVENOUS | Status: AC
  Filled 2015-08-13: qty 1

## 2015-08-13 MED ORDER — SCOPOLAMINE 1 MG/3DAYS TD PT72
MEDICATED_PATCH | TRANSDERMAL | Status: AC
  Filled 2015-08-13: qty 1

## 2015-08-13 MED ORDER — FENTANYL CITRATE (PF) 250 MCG/5ML IJ SOLN
INTRAMUSCULAR | Status: AC
  Filled 2015-08-13: qty 5

## 2015-08-13 MED ORDER — MIDAZOLAM HCL 2 MG/2ML IJ SOLN
INTRAMUSCULAR | Status: DC | PRN
  Administered 2015-08-13: 2 mg via INTRAVENOUS

## 2015-08-13 MED ORDER — ONDANSETRON HCL 4 MG/2ML IJ SOLN
INTRAMUSCULAR | Status: AC
  Filled 2015-08-13: qty 2

## 2015-08-13 MED ORDER — SCOPOLAMINE 1 MG/3DAYS TD PT72
MEDICATED_PATCH | TRANSDERMAL | Status: DC | PRN
  Administered 2015-08-13: 1 via TRANSDERMAL

## 2015-08-13 MED ORDER — ACETAMINOPHEN 325 MG PO TABS: 650.0000 mg | ORAL_TABLET | Freq: Four times a day (QID) | ORAL | Status: DC | PRN

## 2015-08-13 MED ORDER — MEPERIDINE HCL 25 MG/ML IJ SOLN: 6.2500 mg | INTRAMUSCULAR | Status: DC | PRN

## 2015-08-13 MED ORDER — CEFAZOLIN SODIUM-DEXTROSE 2-3 GM-% IV SOLR
2.0000 g | INTRAVENOUS | Status: AC
  Administered 2015-08-13: 2 g via INTRAVENOUS
  Filled 2015-08-13 (×2): qty 50

## 2015-08-13 MED ORDER — MIDAZOLAM HCL 2 MG/2ML IJ SOLN
INTRAMUSCULAR | Status: AC
Start: 2015-08-13 — End: 2015-08-13
  Filled 2015-08-13: qty 4

## 2015-08-13 MED ORDER — PROPOFOL 10 MG/ML IV BOLUS
INTRAVENOUS | Status: DC | PRN
  Administered 2015-08-13: 200 mg via INTRAVENOUS

## 2015-08-13 MED ORDER — METOCLOPRAMIDE HCL 5 MG PO TABS
5.0000 mg | ORAL_TABLET | Freq: Three times a day (TID) | ORAL | Status: DC | PRN
Start: 2015-08-13 — End: 2015-08-13

## 2015-08-13 MED ORDER — LACTATED RINGERS IV SOLN
INTRAVENOUS | Status: DC | PRN
  Administered 2015-08-13 (×2): via INTRAVENOUS

## 2015-08-13 MED ORDER — PROMETHAZINE HCL 25 MG/ML IJ SOLN: 6.2500 mg | INTRAMUSCULAR | Status: DC | PRN

## 2015-08-13 MED ORDER — ROCURONIUM BROMIDE 100 MG/10ML IV SOLN
INTRAVENOUS | Status: DC | PRN
  Administered 2015-08-13: 40 mg via INTRAVENOUS

## 2015-08-13 MED ORDER — FENTANYL CITRATE (PF) 250 MCG/5ML IJ SOLN
INTRAMUSCULAR | Status: DC | PRN
  Administered 2015-08-13: 50 ug via INTRAVENOUS
  Administered 2015-08-13 (×3): 100 ug via INTRAVENOUS
  Administered 2015-08-13: 150 ug via INTRAVENOUS
  Administered 2015-08-13: 50 ug via INTRAVENOUS
  Administered 2015-08-13 (×2): 100 ug via INTRAVENOUS

## 2015-08-13 MED ORDER — CHLORHEXIDINE GLUCONATE 4 % EX LIQD
60.0000 mL | Freq: Once | CUTANEOUS | Status: AC
  Administered 2015-08-13: 4 via TOPICAL
  Filled 2015-08-13: qty 60

## 2015-08-13 MED ORDER — LIDOCAINE HCL (CARDIAC) 20 MG/ML IV SOLN
INTRAVENOUS | Status: DC | PRN
  Administered 2015-08-13: 100 mg via INTRAVENOUS

## 2015-08-13 MED ORDER — ACETAMINOPHEN 650 MG RE SUPP: 650.0000 mg | Freq: Four times a day (QID) | RECTAL | Status: DC | PRN

## 2015-08-13 MED ORDER — ONDANSETRON HCL 4 MG/2ML IJ SOLN
INTRAMUSCULAR | Status: DC | PRN
  Administered 2015-08-13: 4 mg via INTRAVENOUS

## 2015-08-13 MED ORDER — LACTATED RINGERS IV SOLN
INTRAVENOUS | Status: DC
  Administered 2015-08-13: 07:00:00 via INTRAVENOUS

## 2015-08-13 MED ORDER — PROPOFOL 10 MG/ML IV BOLUS
INTRAVENOUS | Status: AC
Start: 2015-08-13 — End: 2015-08-13
  Filled 2015-08-13: qty 20

## 2015-08-13 MED ORDER — HYDROMORPHONE HCL 1 MG/ML IJ SOLN
0.2500 mg | INTRAMUSCULAR | Status: DC | PRN
  Administered 2015-08-13 (×2): 0.5 mg via INTRAVENOUS

## 2015-08-13 MED ORDER — METOCLOPRAMIDE HCL 5 MG/ML IJ SOLN: 5.0000 mg | Freq: Three times a day (TID) | INTRAMUSCULAR | Status: DC | PRN

## 2015-08-13 MED ORDER — HYDROMORPHONE HCL 1 MG/ML IJ SOLN
INTRAMUSCULAR | Status: AC
  Administered 2015-08-13: 0.5 mg via INTRAVENOUS
  Filled 2015-08-13: qty 1

## 2015-08-13 MED ORDER — SODIUM CHLORIDE 0.9 % IR SOLN
Status: DC | PRN
  Administered 2015-08-13: 6000 mL

## 2015-08-13 MED ORDER — CEFAZOLIN SODIUM-DEXTROSE 2-3 GM-% IV SOLR
2.0000 g | Freq: Three times a day (TID) | INTRAVENOUS | Status: DC
  Administered 2015-08-13 – 2015-08-16 (×10): 2 g via INTRAVENOUS
  Filled 2015-08-13 (×12): qty 50

## 2015-08-13 MED ORDER — LIDOCAINE HCL (CARDIAC) 20 MG/ML IV SOLN
INTRAVENOUS | Status: AC
  Filled 2015-08-13: qty 5

## 2015-08-13 SURGICAL SUPPLY — 67 items
BANDAGE ELASTIC 6 VELCRO ST LF (GAUZE/BANDAGES/DRESSINGS) ×3 IMPLANT
BLADE CUDA 4.2 (BLADE) ×3 IMPLANT
BLADE CUTTER GATOR 3.5 (BLADE) IMPLANT
BLADE GREAT WHITE 4.2 (BLADE) IMPLANT
BLADE GREAT WHITE 4.2MM (BLADE)
BLADE SURG 11 STRL SS (BLADE) IMPLANT
BNDG GAUZE ELAST 4 BULKY (GAUZE/BANDAGES/DRESSINGS) ×3 IMPLANT
BOOTCOVER CLEANROOM LRG (PROTECTIVE WEAR) ×6 IMPLANT
BUR OVAL 4.0 (BURR) IMPLANT
CANISTER SUCT LVC 12 LTR MEDI- (MISCELLANEOUS) ×3 IMPLANT
CANNULA ACUFLEX KIT 5X76 (CANNULA) ×3 IMPLANT
CANNULA DRILOCK 5.0MMX75MM (CANNULA)
CANNULA DRILOCK 5.0X75 (CANNULA) IMPLANT
CANNULA TWIST IN 8.25X7CM (CANNULA) IMPLANT
CLOSURE WOUND 1/2 X4 (GAUZE/BANDAGES/DRESSINGS) ×1
DRAPE IMP U-DRAPE 54X76 (DRAPES) ×3 IMPLANT
DRAPE INCISE 23X17 IOBAN STRL (DRAPES)
DRAPE INCISE IOBAN 23X17 STRL (DRAPES) IMPLANT
DRAPE INCISE IOBAN 66X45 STRL (DRAPES) ×3 IMPLANT
DRAPE STERI 35X30 U-POUCH (DRAPES) ×6 IMPLANT
DRAPE SURG 17X11 SM STRL (DRAPES) ×3 IMPLANT
DRAPE U-SHAPE 47X51 STRL (DRAPES) ×3 IMPLANT
DRSG PAD ABDOMINAL 8X10 ST (GAUZE/BANDAGES/DRESSINGS) ×3 IMPLANT
DURAPREP 26ML APPLICATOR (WOUND CARE) ×3 IMPLANT
GAUZE SPONGE 4X4 12PLY STRL (GAUZE/BANDAGES/DRESSINGS) ×6 IMPLANT
GLOVE BIO SURGEON STRL SZ7.5 (GLOVE) ×9 IMPLANT
GLOVE BIO SURGEON STRL SZ8 (GLOVE) ×9 IMPLANT
GLOVE EUDERMIC 7 POWDERFREE (GLOVE) IMPLANT
GLOVE SS BIOGEL STRL SZ 7.5 (GLOVE) ×1 IMPLANT
GLOVE SUPERSENSE BIOGEL SZ 7.5 (GLOVE) ×2
GOWN STRL REUS W/ TWL LRG LVL3 (GOWN DISPOSABLE) ×2 IMPLANT
GOWN STRL REUS W/ TWL XL LVL3 (GOWN DISPOSABLE) ×2 IMPLANT
GOWN STRL REUS W/TWL LRG LVL3 (GOWN DISPOSABLE) ×4
GOWN STRL REUS W/TWL XL LVL3 (GOWN DISPOSABLE) ×4
KIT BASIN OR (CUSTOM PROCEDURE TRAY) ×3 IMPLANT
KIT ROOM TURNOVER OR (KITS) ×3 IMPLANT
KIT SHOULDER TRACTION (DRAPES) ×3 IMPLANT
MANIFOLD NEPTUNE II (INSTRUMENTS) ×3 IMPLANT
NDL SUT 6 .5 CRC .975X.05 MAYO (NEEDLE) IMPLANT
NEEDLE MAYO TAPER (NEEDLE)
NEEDLE SCORPION MULTI FIRE (NEEDLE) IMPLANT
NEEDLE SPNL 18GX3.5 QUINCKE PK (NEEDLE) ×3 IMPLANT
NS IRRIG 1000ML POUR BTL (IV SOLUTION) ×3 IMPLANT
PACK SHOULDER (CUSTOM PROCEDURE TRAY) ×3 IMPLANT
PACK UNIVERSAL I (CUSTOM PROCEDURE TRAY) IMPLANT
PAD ARMBOARD 7.5X6 YLW CONV (MISCELLANEOUS) ×6 IMPLANT
PADDING CAST ABS 6INX4YD NS (CAST SUPPLIES) ×2
PADDING CAST ABS COTTON 6X4 NS (CAST SUPPLIES) ×1 IMPLANT
SET ARTHROSCOPY TUBING (MISCELLANEOUS) ×2
SET ARTHROSCOPY TUBING LN (MISCELLANEOUS) ×1 IMPLANT
SLING ARM LRG ADULT FOAM STRAP (SOFTGOODS) IMPLANT
SLING ARM MED ADULT FOAM STRAP (SOFTGOODS) IMPLANT
SPONGE LAP 4X18 X RAY DECT (DISPOSABLE) IMPLANT
STRIP CLOSURE SKIN 1/2X4 (GAUZE/BANDAGES/DRESSINGS) ×2 IMPLANT
SUT MNCRL AB 3-0 PS2 18 (SUTURE) ×3 IMPLANT
SUT MNCRL AB 4-0 PS2 18 (SUTURE) IMPLANT
SUT PDS AB 1 CT  36 (SUTURE)
SUT PDS AB 1 CT 36 (SUTURE) IMPLANT
SUT TIGER TAPE 7 IN WHITE (SUTURE) IMPLANT
SYR 20CC LL (SYRINGE) IMPLANT
TAPE CLOTH SURG 6X10 WHT LF (GAUZE/BANDAGES/DRESSINGS) ×3 IMPLANT
TAPE FIBER 2MM 7IN #2 BLUE (SUTURE) IMPLANT
TAPE PAPER 3X10 WHT MICROPORE (GAUZE/BANDAGES/DRESSINGS) ×3 IMPLANT
TOWEL OR 17X24 6PK STRL BLUE (TOWEL DISPOSABLE) ×3 IMPLANT
TOWEL OR 17X26 10 PK STRL BLUE (TOWEL DISPOSABLE) ×3 IMPLANT
WAND SUCTION MAX 4MM 90S (SURGICAL WAND) ×3 IMPLANT
WATER STERILE IRR 1000ML POUR (IV SOLUTION) ×3 IMPLANT

## 2015-08-13 NOTE — Anesthesia Procedure Notes (Addendum)
Procedure Name: Intubation Date/Time: 08/13/2015 7:47 AM Performed by: Alanda AmassFRIEDMAN, Xitlally Mooneyham A Pre-anesthesia Checklist: Patient identified, Emergency Drugs available, Suction available, Patient being monitored and Timeout performed Patient Re-evaluated:Patient Re-evaluated prior to inductionOxygen Delivery Method: Circle system utilized Preoxygenation: Pre-oxygenation with 100% oxygen Intubation Type: IV induction Ventilation: Oral airway inserted - appropriate to patient size and Mask ventilation without difficulty Laryngoscope Size: Mac and 3 Grade View: Grade III Tube type: Oral Tube size: 7.5 mm Number of attempts: 2 (First laryngoscopy, pt light and unable to fully open mouth) Airway Equipment and Method: Stylet Placement Confirmation: ETT inserted through vocal cords under direct vision,  positive ETCO2 and breath sounds checked- equal and bilateral Secured at: 22 cm Tube secured with: Tape Dental Injury: Teeth and Oropharynx as per pre-operative assessment

## 2015-08-13 NOTE — Op Note (Signed)
NAMEIORI, GIGANTE NO.:  [id number]  MEDICAL RECORD NO.:  [id number]  LOCATION:  5N11C                        FACILITY:  [facility]  PHYSICIAN:  Vania Rea. Felisia Balcom, M.D.  DATE OF BIRTH:  22-Oct-1970  DATE OF PROCEDURE:  08/13/2015 DATE OF DISCHARGE:                              OPERATIVE REPORT   PREOPERATIVE DIAGNOSIS:  Left shoulder septic arthritis.  POSTOPERATIVE DIAGNOSIS:  Left shoulder septic arthritis.  PROCEDURE: 1. Left knee diagnostic arthroscopy. 2. Arthroscopic lavage. 3. Synovectomy. 4. Labral debridement. 5. Chondroplasty. 6. Subacromial bursectomy with lysis of adhesions. 7. Removal of retained suture material from previous rotator cuff     repair.  SURGEON:  Vania Rea. Alannis Hsia, M.D.  ASSISTANT:  None.  ANESTHESIA:  General endotracheal.  ESTIMATED BLOOD LOSS:  Minimal.  DRAINS:  Hemovac x1.  HISTORY:  Mr. Wellbrock is a 45 year old gentleman known to my practice after previous left shoulder arthroscopic rotator repair that I have performed several years ago.  He was seen in our office this past Wednesday for initial complaints of left shoulder pain after a work related injury that happened approximately 3-1/2 weeks ago.  At presentation time on Wednesday, he had complaints of left shoulder, but also significant left knee pain and the shoulder situation was felt initially represent a recurrent injury to the rotator cuff.  His knee was the most compelling issue at that time with a dense effusion, which upon aspiration revealed yellowish murky, very thick synovial fluid, somewhat of the consistency of banana pudding.  We did obtain a Gram stain and cultures, and this did show a highly elevated white count.  He was taken to surgery this past Thursday 2 days ago, where we performed an arthroscopic lavage and debridement of his left knee and at that time, given the findings in the left knee, I had concerns regarding his left shoulder situation, so  I performed an aspiration of left shoulder this past Thursday 2 days ago.  The reports came back that the white count was elevated and that there was gram-positive cocci growing on the left shoulder.  This information became available yesterday evening.  As such, we are now bringing Mr. Mastrangelo back to the operating room at this morning for planned left shoulder arthroscopic lavage and debridement.  I have previously counseled Mr. Debois regarding treatment options and risks versus benefits thereof.  Possible surgical complications were all reviewed including bleeding persistent infection, and possible need for additional surgery.  We also mentioned that in the phase of infection, we would not anticipate performing any type of repair within the left shoulder at this time.  He understands and accepts and agrees to plan.  PROCEDURE IN DETAIL:  After undergoing routine preop evaluation, the patient was continued on his standard antibiotic course.  Brought to the operating room, placed supine on the operating table, underwent smooth induction of general endotracheal anesthesia.  Turned to the right lateral decubitus position on a beanbag and appropriately padded and protected.  Left shoulder examination under anesthesia reveals some mild restriction and mobility, approximately 140 degrees of elevation, 80 degrees of rotation.  Left arm was then suspended at 70 degrees of abduction with 15 pounds  of traction and the left shoulder region was sterilely prepped and draped in standard fashion.  Time-out was called. A posterior portal established under direct visualization.  Marked synovitis and fibrinous exudate was noted throughout the joint. Performed an extensive synovectomy and debridement.  There was degenerative labral tearing which we debrided.  There was some chondral chondromalacia of the humeral head.  There were some loose chondral flaps.  Chondroplasty was performed.  The rotator cuff  showed an obvious full-thickness defect involved what appear to be majority of the supra and infraspinatus.  The biceps tendon did not show any obvious instability or dislocation.  We completed an aggressive synovectomy and lysis of adhesions.  At this point, the arthroscope was removed from the joint and redirected in the subacromial space.  We then performed an extensive bursectomy with lysis of adhesions and debrided the torn margin of the rotator cuff back to healthy tissue and also removed multiple retained fragments of nonabsorbable suture that had been used during this previous rotator cuff repair several years ago.  We completed the bursectomy, obtained final hemostasis.  Then left a Hemovac drain to lateral subacromial portal.  The portal was subsequently closed with Steri-Strips, although I did use the subcutaneous Monocryl on the lateral portal to help maintain our drain in position.  Dry dressing was the applied.  At this point, when the patient was still under anesthesia, went ahead and did a dressing change on the left knee, we did pull his Hemovac drain and the portals all appeared clean and dry and the left knee dry dressing was applied.  The patient at this point then was awakened, extubated, and taken to the recovery room.     Vania ReaKevin M. Ajeet Casasola, M.D.     KMS/MEDQ  D:  08/13/2015  T:  08/13/2015  Job:  [id number]

## 2015-08-13 NOTE — Op Note (Signed)
08/11/2015 - 08/13/2015  9:51 AM  PATIENT:   Osa CraverJeremy D Vold  44 y.o. male  PRE-OPERATIVE DIAGNOSIS:  Left Shoulder Infection  POST-OPERATIVE DIAGNOSIS:  Same with finding of massive rotator cuff tear  PROCEDURE:  LSA, labral debridement, chondroplasty, synovectomy, bursectomy, LOA, removal of retained suture material  SURGEON:  Brent Taillon, Vania ReaKevin M. M.D.  ASSISTANTS: none   ANESTHESIA:   GET   EBL: minimal  SPECIMEN:  none  Drains: hemovac x 1   PATIENT DISPOSITION:  PACU - hemodynamically stable.    PLAN OF CARE: Admit to inpatient   Dictation# [id number]   Contact # [phone]

## 2015-08-13 NOTE — Progress Notes (Signed)
Addendum: Plan to D/C shoulder drain when output minimal F/U on shoulder cultures, anticipate same organism as knee Route and duration of ABX as per ID

## 2015-08-13 NOTE — Evaluation (Signed)
Physical Therapy Evaluation Patient Details Name: Raymond Peterson MRN: [id number] DOB: 1971/04/02 Today's Date: 08/13/2015   History of Present Illness  Pt is a 44 y/o M admitted w/ 2 wks of increasing Lt knee and Lt shoulder pain.  Pt w/ infected Lt shoulder and knee.  Pt has massive Lt rotator cuff tear now s/p LSA, labral debridement, chondroplasty, synovectomy, busectomy, LOA, removal of retained suture material.  In addition, pt is s/p Lt knee arthroscopic lavage and extensive synovectomy.    Clinical Impression  Pt admitted with above diagnosis. Pt currently with functional limitations due to the deficits listed below (see PT Problem List). Raymond Peterson is motivated to return home but is limited by his severe pain in Lt knee and Lt shoulder.  Mod assist for bed mobility and min assist for squat pivot this session.  He will have 24/7 assist available from his wife at d/c.  Anticipate that once pain is well controlled, pt will progress well. Pt will benefit from skilled PT to increase their independence and safety with mobility to allow discharge to the venue listed below.      Follow Up Recommendations CIR    Equipment Recommendations  Other (comment) (TBD.  Platform RW?)    Recommendations for Other Services OT consult     Precautions / Restrictions Precautions Precautions: Fall Precaution Comments: massive Lt rotator cuff tear Restrictions Weight Bearing Restrictions: Yes LUE Weight Bearing:  (per order: "Ok for adlib motion and use of L UE") LLE Weight Bearing:  (Full WB)      Mobility  Bed Mobility Overal bed mobility: Needs Assistance;+2 for physical assistance Bed Mobility: Supine to Sit     Supine to sit: Mod assist;+2 for physical assistance;HOB elevated     General bed mobility comments: Assist w/ managing Lt LE and assist providing 1 person HHA w/ pulling up to sitting.  Pt moves very slowly and was educated on breathing technique for  relaxation.  Transfers Overall transfer level: Needs assistance Equipment used: None Transfers: Stand Pivot Transfers   Stand pivot transfers: Min assist       General transfer comment: Min assist managing Lt LE and Lt UE and cues for proper hand placement and technique.  Pt pivot/hops on Rt LE to turn and sit in chair.  Ambulation/Gait                Stairs            Wheelchair Mobility    Modified Rankin (Stroke Patients Only)       Balance Overall balance assessment: Needs assistance Sitting-balance support: Feet supported;Single extremity supported Sitting balance-Leahy Scale: Fair     Standing balance support: During functional activity;Single extremity supported Standing balance-Leahy Scale: Poor Standing balance comment: Pt holding onto armrest of recliner chair during transfer                             Pertinent Vitals/Pain Pain Assessment: Faces Pain Score: 10-Worst pain ever Faces Pain Scale: Hurts whole lot Pain Location: Lt knee and Lt shoulder Pain Descriptors / Indicators: Aching;Constant;Grimacing;Guarding;Moaning Pain Intervention(s): Limited activity within patient's tolerance;Monitored during session;Repositioned    Home Living Family/patient expects to be discharged to:: Private residence Living Arrangements: Spouse/significant other;Children (wife and 5 y/o child) Available Help at Discharge: Family;Available 24 hours/day (from wife) Type of Home: House Home Access: Ramped entrance     Home Layout: One level Home Equipment: Crutches;Wheelchair - manual  Additional Comments: wife is disabled, reports she has a "bad back" but assist therapist throughout session.    Prior Function Level of Independence: Needs assistance   Gait / Transfers Assistance Needed: Used crutch in Rt UE 2/2 Lt shoulder pain  ADL's / Homemaking Assistance Needed: Needed assist w/ bathing and dressing from wife        Hand Dominance         Extremity/Trunk Assessment   Upper Extremity Assessment: Defer to OT evaluation;LUE deficits/detail       LUE Deficits / Details: severe Lt rotator cuff tear   Lower Extremity Assessment: LLE deficits/detail   LLE Deficits / Details: very sensitive to any ROM     Communication   Communication: No difficulties  Cognition Arousal/Alertness: Awake/alert Behavior During Therapy: WFL for tasks assessed/performed Overall Cognitive Status: Within Functional Limits for tasks assessed                      General Comments General comments (skin integrity, edema, etc.): Min functional use of Lt UE.  Pt and pt's wife say, "he can't move that arm".  Lt UE "hanging" by pt's side during supine>sit and requires positioning.  Pt may benefit from use of platform RW if unable to use Lt UE.    Exercises General Exercises - Lower Extremity Quad Sets: AROM;Left;5 reps;Supine (lacking ~40 deg Lt knee extension 2/2 pain)      Assessment/Plan    PT Assessment Patient needs continued PT services  PT Diagnosis Difficulty walking;Abnormality of gait;Acute pain   PT Problem List Decreased strength;Decreased range of motion;Decreased activity tolerance;Decreased balance;Decreased mobility;Decreased knowledge of use of DME;Decreased safety awareness;Decreased knowledge of precautions;Decreased skin integrity;Pain  PT Treatment Interventions DME instruction;Gait training;Functional mobility training;Therapeutic activities;Therapeutic exercise;Balance training;Neuromuscular re-education;Cognitive remediation;Patient/family education;Wheelchair mobility training;Modalities   PT Goals (Current goals can be found in the Care Plan section) Acute Rehab PT Goals Patient Stated Goal: decreased pain PT Goal Formulation: With patient/family Time For Goal Achievement: 08/27/15 Potential to Achieve Goals: Good    Frequency Min 5X/week   Barriers to discharge        Co-evaluation                End of Session   Activity Tolerance: Patient limited by pain Patient left: in chair;with call bell/phone within reach;with family/visitor present Nurse Communication: Mobility status;Precautions;Other (comment) (technique to return pt to bed)         Time: [id number] PT Time Calculation (min) (ACUTE ONLY): 24 min   Charges:   PT Evaluation $Initial PT Evaluation Tier I: 1 Procedure PT Treatments $Therapeutic Activity: 8-22 mins   PT G Codes:       Michail JewelsAshley Parr PT, DPT [phone] Pager: [phone] 08/13/2015, 4:58 PM

## 2015-08-13 NOTE — Transfer of Care (Signed)
Immediate Anesthesia Transfer of Care Note  Patient: Raymond Peterson  Procedure(s) Performed: Procedure(s): Arthroscopic Washout Of Left Shoulder (Left)  Patient Location: PACU  Anesthesia Type:General  Level of Consciousness: sedated  Airway & Oxygen Therapy: Patient Spontanous Breathing and Patient connected to nasal cannula oxygen  Post-op Assessment: Report given to RN and Post -op Vital signs reviewed and stable  Post vital signs: Reviewed and stable  Last Vitals:  Filed Vitals:   08/13/15 0520  BP: 138/57  Pulse: 103  Temp: 37.1 C  Resp: 18    Complications: No apparent anesthesia complications

## 2015-08-13 NOTE — Anesthesia Postprocedure Evaluation (Signed)
Anesthesia Post Note  Patient: Raymond Peterson  Procedure(s) Performed: Procedure(s) (LRB): Arthroscopic Washout Of Left Shoulder (Left)  Anesthesia type: General +SCB  Patient location: PACU  Post pain: Pain level controlled  Post assessment: Post-op Vital signs reviewed  Last Vitals: BP 153/64 mmHg  Pulse 116  Temp(Src) 36.8 C (Oral)  Resp 18  Ht 5\' 11"  (1.803 m)  Wt 255 lb (115.667 kg)  BMI 35.58 kg/m2  SpO2 96%  Post vital signs: Reviewed  Level of consciousness: sedated  Complications: No apparent anesthesia complications

## 2015-08-13 NOTE — Progress Notes (Addendum)
PA notified d/t clarification of PT and ROM of Left upper and lower extremities.  This d/t patient and patient's spouse stating "he can't move that arm at all, the Doctor told him not to!"  During re-assessment of pt's extremities, patient would not move arm or leg when asked, along with moving himself up in chair.  Education began for non-compliance and complications when extremities are not moved/exercised for a 44yr old male.  Instructed patient of MD orders for ROM then began instruction(s) for movement of LLE and LUE with teachback.  Examples, foot in plantar and dorsiflexion movement for LLE with and without bending of knee.  Instructed patient for shoulder as well.  Patient stated "I can't" , assertive instructions were then given to patient and spouse by this RN again.  Nsg to continue to monitor for status changes.   If this RN has patient tomorrow, will follow up with MD or PA.

## 2015-08-13 NOTE — Progress Notes (Signed)
Patient ID: Raymond Peterson, male   DOB: 21-Aug-1971, 44 y.o.   MRN: [id number]         [facility]    Date of Admission:  08/11/2015           Day 3 cefazolin  Active Problems:   Septic joint of left knee joint (HCC)   .  ceFAZolin (ANCEF) IV  2 g Intravenous Q8H  . docusate sodium  100 mg Oral BID  . enoxaparin (LOVENOX) injection  40 mg Subcutaneous Q24H  . scopolamine        OBJECTIVE: Filed Vitals:   08/13/15 1000 08/13/15 1015 08/13/15 1104 08/13/15 1107  BP: 151/49  153/64   Pulse: 112  116   Temp:  98.2 F (36.8 C) 98.3 F (36.8 C)   TempSrc:   Oral   Resp: 15  18   Height:      Weight:      SpO2: 100%  96% 96%   Body mass index is 35.58 kg/(m^2).  Lab Results Lab Results  Component Value Date   WBC 9.9 08/13/2015   HGB 12.2* 08/13/2015   HCT 36.4* 08/13/2015   MCV 93.8 08/13/2015   PLT 518* 08/13/2015    Lab Results  Component Value Date   CREATININE 0.88 08/12/2015   BUN 11 08/12/2015   NA 135 08/12/2015   K 3.9 08/12/2015   CL 96* 08/12/2015   CO2 30 08/12/2015    Lab Results  Component Value Date   ALT 54 08/12/2015   AST 50* 08/12/2015   ALKPHOS 183* 08/12/2015   BILITOT 0.9 08/12/2015     Microbiology: Recent Results (from the past 240 hour(s))  Body fluid culture     Status: None (Preliminary result)   Collection Time: 08/11/15  4:24 PM  Result Value Ref Range Status   Specimen Description SYNOVIAL LEFT SHOULDER  Final   Special Requests SPECIMEN A PT ON ANCEF  Final   Gram Stain   Final    ABUNDANT WBC PRESENT, PREDOMINANTLY PMN NO ORGANISMS SEEN    Culture   Final    GRAM POSITIVE COCCI CULTURE REINCUBATED FOR BETTER GROWTH CRITICAL RESULT CALLED TO, READ BACK BY AND VERIFIED WITH: P QUSSOUR 08/12/15 @ 1424 M VESTAL    Report Status PENDING  Incomplete  Surgical pcr screen     Status: Abnormal   Collection Time: 08/13/15  5:27 AM  Result Value Ref Range Status   MRSA, PCR NEGATIVE NEGATIVE Final    Staphylococcus aureus POSITIVE (A) NEGATIVE Final    Comment:        The Xpert SA Assay (FDA approved for NASAL specimens in patients over 57 years of age), is one component of a comprehensive surveillance program.  Test performance has been validated by [facility] for patients greater than or equal to 35 year old. It is not intended to diagnose infection nor to guide or monitor treatment.      ASSESSMENT: He has polyarticular septic arthritis involving his left knee and left shoulder. Both have had arthroscopic washout. Outpatient left knee cultures grew MSSA and left shoulder aspirate is growing gram-positive cocci. Blood cultures done today on antibiotics are pending. He has defervesced. Blood cultures are negative he needs PICC placement in anticipation of several weeks of IV cefazolin therapy.  PLAN: 1. Continue cefazolin 2. Await final culture results  Cliffton Asters, MD [facility] [phone] pager   [phone] cell  08/13/2015, 5:55 PM

## 2015-08-14 ENCOUNTER — Encounter (HOSPITAL_COMMUNITY): Payer: Self-pay | Admitting: Orthopedic Surgery

## 2015-08-14 LAB — CBC WITH DIFFERENTIAL/PLATELET
BASOS PCT: 0 %
Basophils Absolute: 0 10*3/uL (ref 0.0–0.1)
EOS ABS: 0.1 10*3/uL (ref 0.0–0.7)
Eosinophils Relative: 1 %
HEMATOCRIT: 37.5 % — AB (ref 39.0–52.0)
HEMOGLOBIN: 12 g/dL — AB (ref 13.0–17.0)
LYMPHS ABS: 0.9 10*3/uL (ref 0.7–4.0)
Lymphocytes Relative: 8 %
MCH: 30.2 pg (ref 26.0–34.0)
MCHC: 32 g/dL (ref 30.0–36.0)
MCV: 94.2 fL (ref 78.0–100.0)
MONO ABS: 0.7 10*3/uL (ref 0.1–1.0)
MONOS PCT: 7 %
NEUTROS ABS: 9.5 10*3/uL — AB (ref 1.7–7.7)
Neutrophils Relative %: 84 %
Platelets: 396 10*3/uL (ref 150–400)
RBC: 3.98 MIL/uL — ABNORMAL LOW (ref 4.22–5.81)
RDW: 13 % (ref 11.5–15.5)
WBC: 11.3 10*3/uL — ABNORMAL HIGH (ref 4.0–10.5)

## 2015-08-14 LAB — BODY FLUID CULTURE

## 2015-08-14 NOTE — Progress Notes (Signed)
ANTIBIOTIC CONSULT NOTE  Pharmacy Consult for Cefazolin Indication: septic arthritis  No Known Allergies Patient Measurements: Height:  (180.3 cm) Weight: 255 lb (115.667 kg) IBW/kg (Calculated) : 75.3 Vital Signs: Temp: 100.3 F (37.9 C) (11/06 0513) Temp Source: Oral (11/06 0513) BP: 133/59 mmHg (11/06 0513) Pulse Rate: 110 (11/06 0513) Intake/Output from this shift: Total I/O In: 240 [P.O.:240] Out: -   Labs:  Recent Labs  08/12/15 0337 08/13/15 0510 08/14/15 0441  WBC 11.0* 9.9 11.3*  HGB 12.0* 12.2* 12.0*  PLT 506* 518* 396  CREATININE 0.88  --   --    Estimated Creatinine Clearance: 138.6 mL/min (by C-G formula based on Cr of 0.88). No results for input(s): VANCOTROUGH, VANCOPEAK, VANCORANDOM, GENTTROUGH, GENTPEAK, GENTRANDOM, TOBRATROUGH, TOBRAPEAK, TOBRARND, AMIKACINPEAK, AMIKACINTROU, AMIKACIN in the last 72 hours.   Microbiology: Recent Results (from the past 720 hour(s))  Body fluid culture     Status: None   Collection Time: 08/11/15  4:24 PM  Result Value Ref Range Status   Specimen Description SYNOVIAL LEFT SHOULDER  Final   Special Requests SPECIMEN A PT ON ANCEF  Final   Gram Stain   Final    ABUNDANT WBC PRESENT, PREDOMINANTLY PMN NO ORGANISMS SEEN    Culture   Final    STAPHYLOCOCCUS AUREUS CRITICAL RESULT CALLED TO, READ BACK BY AND VERIFIED WITH: P QUSSOUR 08/12/15 @ 1424 M VESTAL    Report Status 08/14/2015 FINAL  Final   Organism ID, Bacteria STAPHYLOCOCCUS AUREUS  Final      Susceptibility   Staphylococcus aureus - MIC*    CIPROFLOXACIN <=0.5 SENSITIVE Sensitive     ERYTHROMYCIN 0.5 SENSITIVE Sensitive     GENTAMICIN <=0.5 SENSITIVE Sensitive     OXACILLIN 0.5 SENSITIVE Sensitive     TETRACYCLINE <=1 SENSITIVE Sensitive     VANCOMYCIN <=0.5 SENSITIVE Sensitive     TRIMETH/SULFA <=10 SENSITIVE Sensitive     CLINDAMYCIN <=0.25 SENSITIVE Sensitive     RIFAMPIN <=0.5 SENSITIVE Sensitive     Inducible Clindamycin NEGATIVE  Sensitive     * STAPHYLOCOCCUS AUREUS  Surgical pcr screen     Status: Abnormal   Collection Time: 08/13/15  5:27 AM  Result Value Ref Range Status   MRSA, PCR NEGATIVE NEGATIVE Final   Staphylococcus aureus POSITIVE (A) NEGATIVE Final    Comment:        The Xpert SA Assay (FDA approved for NASAL specimens in patients over 44 years of age), is one component of a comprehensive surveillance program.  Test performance has been validated by [facility] for patients greater than or equal to 12 year old. It is not intended to diagnose infection nor to guide or monitor treatment.   Culture, blood (routine x 2)     Status: None (Preliminary result)   Collection Time: 08/13/15  5:35 AM  Result Value Ref Range Status   Specimen Description BLOOD LEFT WRIST  Final   Special Requests BOTTLES DRAWN AEROBIC AND ANAEROBIC 10CC  Final   Culture NO GROWTH 1 DAY  Final   Report Status PENDING  Incomplete  Culture, blood (routine x 2)     Status: None (Preliminary result)   Collection Time: 08/13/15  5:40 AM  Result Value Ref Range Status   Specimen Description BLOOD LEFT HAND  Final   Special Requests BOTTLES DRAWN AEROBIC AND ANAEROBIC 5CC  Final   Culture NO GROWTH 1 DAY  Final   Report Status PENDING  Incomplete    Medications:  Anti-infectives  Start     Dose/Rate Route Frequency Ordered Stop   08/13/15 1500  ceFAZolin (ANCEF) IVPB 2 g/50 mL premix     2 g 100 mL/hr over 30 Minutes Intravenous Every 8 hours 08/13/15 0337     08/13/15 0700  ceFAZolin (ANCEF) IVPB 2 g/50 mL premix     2 g 100 mL/hr over 30 Minutes Intravenous To Surgery 08/13/15 0327 08/13/15 0755   08/11/15 2200  ceFAZolin (ANCEF) IVPB 2 g/50 mL premix  Status:  Discontinued     2 g 100 mL/hr over 30 Minutes Intravenous 3 times per day 08/11/15 1855 08/13/15 0336   08/11/15 2030  ceFAZolin (ANCEF) IVPB 2 g/50 mL premix  Status:  Discontinued     2 g 100 mL/hr over 30 Minutes Intravenous Every 6 hours 08/11/15  1720 08/11/15 1855   08/11/15 0848  ceFAZolin (ANCEF) 2-3 GM-% IVPB SOLR    Comments:  Forte, Lindsi   : cabinet override      08/11/15 0848 08/11/15 2059   08/11/15 0845  ceFAZolin (ANCEF) IVPB 2 g/50 mL premix     2 g 100 mL/hr over 30 Minutes Intravenous On call to O.R. 08/11/15 0841 08/11/15 1431     Assessment: 44 year old obese male with MSSA  arthritis of native joint to continue on cefazolin . Plan per ID for 2 weeks of IV therapy followed by 2 weeks of oral.   11/3: L shoulder synovial fluid: MSSA (pan-S) 11/5 BCx2>> Outpatient aspiration cx>>MSSA (per ID note)  Cefazolin 11/3>>   Goal of Therapy:  Clinical resolution of infection  Plan:  No ancef changes needed.  Will sign off. Please contact pharmacy with any further needs  Harland Germanndrew Feras Gardella, Ilda Bassetharm D 08/14/2015 1:55 PM

## 2015-08-14 NOTE — Progress Notes (Signed)
Patient ID: Raymond Peterson, male   DOB: April 13, 1971, 44 y.o.   MRN: [id number]         [facility]    Date of Admission:  08/11/2015           Day 4 cefazolin  Active Problems:   Septic joint of left knee joint (HCC)   .  ceFAZolin (ANCEF) IV  2 g Intravenous Q8H  . docusate sodium  100 mg Oral BID  . enoxaparin (LOVENOX) injection  40 mg Subcutaneous Q24H    SUBJECTIVE: He states that his pain is tolerable. He denies having had any fever, chills or sweats at home prior to admission.  Review of Systems: Pertinent items are noted in HPI.  Past Medical History  Diagnosis Date  . PONV (postoperative nausea and vomiting)     "just once"  . Medical history non-contributory     Social History  Substance Use Topics  . Smoking status: Never Smoker   . Smokeless tobacco: None  . Alcohol Use: No    History reviewed. No pertinent family history. No Known Allergies  OBJECTIVE: Filed Vitals:   08/13/15 2020 08/13/15 2200 08/14/15 0015 08/14/15 0513  BP: 150/57  128/62 133/59  Pulse: 116  102 110  Temp: 101.4 F (38.6 C) 98.8 F (37.1 C) 98 F (36.7 C) 100.3 F (37.9 C)  TempSrc: Oral Oral Oral Oral  Resp: 18  16 16   Height:      Weight:      SpO2: 94%  94% 93%   Body mass index is 35.58 kg/(m^2).  General: he is sitting up in a chair. He is pale. He is in no distress Skin: new right arm PICC Lungs: clear Cor: regular S1 and S2 with no murmur Bandages on left shoulder and left knee  Lab Results Lab Results  Component Value Date   WBC 11.3* 08/14/2015   HGB 12.0* 08/14/2015   HCT 37.5* 08/14/2015   MCV 94.2 08/14/2015   PLT 396 08/14/2015    Lab Results  Component Value Date   CREATININE 0.88 08/12/2015   BUN 11 08/12/2015   NA 135 08/12/2015   K 3.9 08/12/2015   CL 96* 08/12/2015   CO2 30 08/12/2015    Lab Results  Component Value Date   ALT 54 08/12/2015   AST 50* 08/12/2015   ALKPHOS 183* 08/12/2015   BILITOT 0.9  08/12/2015     Microbiology: Recent Results (from the past 240 hour(s))  Body fluid culture     Status: None   Collection Time: 08/11/15  4:24 PM  Result Value Ref Range Status   Specimen Description SYNOVIAL LEFT SHOULDER  Final   Special Requests SPECIMEN A PT ON ANCEF  Final   Gram Stain   Final    ABUNDANT WBC PRESENT, PREDOMINANTLY PMN NO ORGANISMS SEEN    Culture   Final    STAPHYLOCOCCUS AUREUS CRITICAL RESULT CALLED TO, READ BACK BY AND VERIFIED WITH: P QUSSOUR 08/12/15 @ 1424 M VESTAL    Report Status 08/14/2015 FINAL  Final   Organism ID, Bacteria STAPHYLOCOCCUS AUREUS  Final      Susceptibility   Staphylococcus aureus - MIC*    CIPROFLOXACIN <=0.5 SENSITIVE Sensitive     ERYTHROMYCIN 0.5 SENSITIVE Sensitive     GENTAMICIN <=0.5 SENSITIVE Sensitive     OXACILLIN 0.5 SENSITIVE Sensitive     TETRACYCLINE <=1 SENSITIVE Sensitive     VANCOMYCIN <=0.5 SENSITIVE Sensitive  TRIMETH/SULFA <=10 SENSITIVE Sensitive     CLINDAMYCIN <=0.25 SENSITIVE Sensitive     RIFAMPIN <=0.5 SENSITIVE Sensitive     Inducible Clindamycin NEGATIVE Sensitive     * STAPHYLOCOCCUS AUREUS  Surgical pcr screen     Status: Abnormal   Collection Time: 08/13/15  5:27 AM  Result Value Ref Range Status   MRSA, PCR NEGATIVE NEGATIVE Final   Staphylococcus aureus POSITIVE (A) NEGATIVE Final    Comment:        The Xpert SA Assay (FDA approved for NASAL specimens in patients over 64 years of age), is one component of a comprehensive surveillance program.  Test performance has been validated by [facility] for patients greater than or equal to 39 year old. It is not intended to diagnose infection nor to guide or monitor treatment.   Culture, blood (routine x 2)     Status: None (Preliminary result)   Collection Time: 08/13/15  5:35 AM  Result Value Ref Range Status   Specimen Description BLOOD LEFT WRIST  Final   Special Requests BOTTLES DRAWN AEROBIC AND ANAEROBIC 10CC  Final   Culture NO  GROWTH 1 DAY  Final   Report Status PENDING  Incomplete  Culture, blood (routine x 2)     Status: None (Preliminary result)   Collection Time: 08/13/15  5:40 AM  Result Value Ref Range Status   Specimen Description BLOOD LEFT HAND  Final   Special Requests BOTTLES DRAWN AEROBIC AND ANAEROBIC 5CC  Final   Culture NO GROWTH 1 DAY  Final   Report Status PENDING  Incomplete     ASSESSMENT: He has MSSA septic arthritis of his left shoulder and left knee. Blood cultures obtained after starting antibiotics are negative to date. I will order a transthoracic echocardiogram to look for any evidence of endocarditis.  PLAN: 1. Continue cefazolin 2. Await final blood cultures and results of TTE  Cliffton Asters, MD [facility] for Infectious Disease [facility] [phone] pager   [phone] cell 08/14/2015, 2:02 PM

## 2015-08-14 NOTE — Progress Notes (Signed)
Subjective: 1 Day Post-Op Procedure(s) (LRB): Arthroscopic Washout Of Left Shoulder (Left) Patient reports pain as moderate to shoulder and knee.  Tolerating PO's. Denies SOB, CP, calf pain. Family at bedside.   Objective: Vital signs in last 24 hours: Temp:  [98 F (36.7 C)-101.4 F (38.6 C)] 100.3 F (37.9 C) (11/06 0513) Pulse Rate:  [102-116] 110 (11/06 0513) Resp:  [16-18] 16 (11/06 0513) BP: (128-153)/(57-64) 133/59 mmHg (11/06 0513) SpO2:  [93 %-96 %] 93 % (11/06 0513) FiO2 (%):  [28 %] 28 % (11/05 1107)  Intake/Output from previous day: 11/05 0701 - 11/06 0700 In: 1800 [I.V.:1800] Out: 715 [Urine:650; Drains:60; Blood:5] Intake/Output this shift: Total I/O In: 240 [P.O.:240] Out: -    Recent Labs  08/12/15 0337 08/13/15 0510 08/14/15 0441  HGB 12.0* 12.2* 12.0*    Recent Labs  08/13/15 0510 08/14/15 0441  WBC 9.9 11.3*  RBC 3.88* 3.98*  HCT 36.4* 37.5*  PLT 518* 396    Recent Labs  08/12/15 0337  NA 135  K 3.9  CL 96*  CO2 30  BUN 11  CREATININE 0.88  GLUCOSE 142*  CALCIUM 8.2*   No results for input(s): LABPT, INR in the last 72 hours.  Alert and oriented x3. RRR, Lungs clear, BS x4. Left Calf soft and non tender. L knee dressing C/D/I. No DVT signs. No signs of infection or compartment syndrome. LLE grossly neurovascularly intact. Left shoulder dressing clean and dry. Drain d/c'ed with tip intact. Pt tolerated.    Assessment/Plan: 1 Day Post-Op Procedure(s) (LRB): Arthroscopic Washout Of Left Shoulder (Left) Shoulder drain d/c'ed. PT/OT ordered Plan d/c when ready ID following Cultures pending Continue current care   Ade Stmarie L 08/14/2015, 10:50 AM

## 2015-08-14 NOTE — Progress Notes (Addendum)
Patient with Left shoulder septic arthritis s/p Arthroscopic Washout Of Left Shoulder (Left). [facility] but now, referral sent to CIR for evaluation. PT/OT continue to follow. Cm spoke to patient at the bedside who states that he is not sure if he wants to go to CIR or Home with HH. Patient agreeable to eval and then will decide depending on that conversation. Patient lives at home with spouse and the only DME he has are crutches so may need BSC and walker depending on how he progresses. CM will continue to follow for potential discharge planning needs/disposition.

## 2015-08-14 NOTE — Progress Notes (Signed)
Physical Therapy Treatment Patient Details Name: Raymond CraverJeremy D Peterson MRN: [id number] DOB: [birth year] Today's Date: 08/14/2015    History of Present Illness Pt is a 44 y/o M admitted w/ 2 wks of increasing Lt knee and Lt shoulder pain.  Pt w/ infected Lt shoulder and knee.  Pt has massive Lt rotator cuff tear now s/p LSA, labral debridement, chondroplasty, synovectomy, busectomy, LOA, removal of retained suture material.  In addition, pt is s/p Lt knee arthroscopic lavage and extensive synovectomy.      PT Comments    Raymond Peterson made good progress today, ambulating 50 ft into hallway using RW and min guard assist.  He continues to have reservation about moving Lt UE and says, "I can't move it", but pt functionally using Lt UE during transfers and ambulation w/ RW this session.  Encouraged pt to incorporate Lt LE and Lt UE in all functional movements.  Pt's family is very supportive and pt remains appropriate for CIR to reach mod I level of mobility.     Follow Up Recommendations  CIR     Equipment Recommendations  Other (comment) (TBD.  Platform RW?)    Recommendations for Other Services OT consult     Precautions / Restrictions Precautions Precautions: Fall Precaution Comments: massive Lt rotator cuff tear Restrictions Weight Bearing Restrictions: Yes LUE Weight Bearing:  (per order: "Ok for adlib motion and use of L UE") LLE Weight Bearing:  (Full WB)    Mobility  Bed Mobility Overal bed mobility: Needs Assistance Bed Mobility: Supine to Sit     Supine to sit: HOB elevated;Supervision     General bed mobility comments: HOB elevated and use of bed rail w/ increased time.  Cues for technique but pt does not require physical assist.  Transfers Overall transfer level: Needs assistance Equipment used: Rolling walker (2 wheeled) Transfers: Sit to/from Stand Sit to Stand: Min assist;+2 safety/equipment         General transfer comment: Min assist steadying RW during  sit>stand.  Cues for hand placement and technique.  Uses Rt UE to place Lt hand on RW during sit>stand but uses Lt UE functionally to assist in controlled descent to recliner chair at end of session.  Ambulation/Gait Ambulation/Gait assistance: Min guard Ambulation Distance (Feet): 50 Feet Assistive device: Rolling walker (2 wheeled) Gait Pattern/deviations: Step-to pattern;Decreased stride length;Decreased stance time - left;Antalgic;Trunk flexed;Decreased weight shift to left   Gait velocity interpretation: Below normal speed for age/gender General Gait Details: Dec weight shift to Lt LE, cues to place entire foot flat on floor during stance phase as pt tends to step on toes.  Pt functionally using Lt UE on RW to WB to offload Lt LE.   Stairs            Wheelchair Mobility    Modified Rankin (Stroke Patients Only)       Balance     Sitting balance-Leahy Scale: Fair       Standing balance-Leahy Scale: Poor                      Cognition Arousal/Alertness: Awake/alert Behavior During Therapy: WFL for tasks assessed/performed Overall Cognitive Status: Within Functional Limits for tasks assessed                      Exercises General Exercises - Lower Extremity Quad Sets: AROM;Left;5 reps;Supine (lacking ~40 deg Lt knee extension 2/2 pain)    General Comments  Pertinent Vitals/Pain Pain Assessment: Faces Faces Pain Scale: Hurts whole lot Pain Location: Lt knee and Lt shoulder Pain Descriptors / Indicators: Aching;Sharp;Tightness Pain Intervention(s): Limited activity within patient's tolerance;Monitored during session;Repositioned;Premedicated before session    Home Living                      Prior Function            PT Goals (current goals can now be found in the care plan section) Acute Rehab PT Goals Patient Stated Goal: decreased pain PT Goal Formulation: With patient/family Time For Goal Achievement:  08/27/15 Potential to Achieve Goals: Good    Frequency  Min 5X/week    PT Plan      Co-evaluation             End of Session   Activity Tolerance: Patient limited by pain Patient left: in chair;with call bell/phone within reach;with family/visitor present     Time: 4098-1191 PT Time Calculation (min) (ACUTE ONLY): 20 min  Charges:  $Gait Training: 8-22 mins                    G Codes:      Michail Jewels PT, DPT [phone] Pager: [phone] 08/14/2015, 1:03 PM

## 2015-08-15 ENCOUNTER — Encounter (HOSPITAL_COMMUNITY): Payer: Self-pay | Admitting: Orthopedic Surgery

## 2015-08-15 ENCOUNTER — Ambulatory Visit (HOSPITAL_COMMUNITY): Payer: BLUE CROSS/BLUE SHIELD

## 2015-08-15 DIAGNOSIS — R7881 Bacteremia: Secondary | ICD-10-CM

## 2015-08-15 DIAGNOSIS — M00069 Staphylococcal arthritis, unspecified knee: Secondary | ICD-10-CM

## 2015-08-15 LAB — CBC WITH DIFFERENTIAL/PLATELET
BASOS ABS: 0 10*3/uL (ref 0.0–0.1)
BASOS PCT: 0 %
EOS ABS: 0.2 10*3/uL (ref 0.0–0.7)
EOS PCT: 2 %
HCT: 36 % — ABNORMAL LOW (ref 39.0–52.0)
Hemoglobin: 11.6 g/dL — ABNORMAL LOW (ref 13.0–17.0)
Lymphocytes Relative: 8 %
Lymphs Abs: 0.8 10*3/uL (ref 0.7–4.0)
MCH: 30.4 pg (ref 26.0–34.0)
MCHC: 32.2 g/dL (ref 30.0–36.0)
MCV: 94.2 fL (ref 78.0–100.0)
MONO ABS: 0.8 10*3/uL (ref 0.1–1.0)
Monocytes Relative: 8 %
Neutro Abs: 8.3 10*3/uL — ABNORMAL HIGH (ref 1.7–7.7)
Neutrophils Relative %: 82 %
PLATELETS: 341 10*3/uL (ref 150–400)
RBC: 3.82 MIL/uL — AB (ref 4.22–5.81)
RDW: 13 % (ref 11.5–15.5)
WBC: 10.1 10*3/uL (ref 4.0–10.5)

## 2015-08-15 MED ORDER — CEFAZOLIN SODIUM-DEXTROSE 2-3 GM-% IV SOLR: 2.0000 g | Freq: Three times a day (TID) | INTRAVENOUS | Status: DC

## 2015-08-15 MED ORDER — DIAZEPAM 5 MG PO TABS: 5.0000 mg | ORAL_TABLET | Freq: Four times a day (QID) | ORAL | Status: DC | PRN

## 2015-08-15 MED ORDER — OXYCODONE-ACETAMINOPHEN 5-325 MG PO TABS: 1.0000 | ORAL_TABLET | ORAL | Status: DC | PRN

## 2015-08-15 NOTE — Progress Notes (Signed)
[facility]   Reason for visit: Follow up on septic arthritis with MSSA  Interval History: he grew MSSA in shoulder as well, blood cultures on treatment negative at 48 hours.  Physical Exam: Constitutional:  Filed Vitals:   08/15/15 0744  BP: 146/65  Pulse: 110  Temp: 99.6 F (37.6 C)  Resp: 16   patient appears in NAD Eyes: anicteric HENT: no thrush Respiratory: Normal respiratory effort; CTA B Cardiovascular: RRR  Review of Systems: Constitutional: negative for fevers and chills Gastrointestinal: negative for diarrhea  Lab Results  Component Value Date   WBC 10.1 08/15/2015   HGB 11.6* 08/15/2015   HCT 36.0* 08/15/2015   MCV 94.2 08/15/2015   PLT 341 08/15/2015    Lab Results  Component Value Date   CREATININE 0.88 08/12/2015   BUN 11 08/12/2015   NA 135 08/12/2015   K 3.9 08/12/2015   CL 96* 08/12/2015   CO2 30 08/12/2015    Lab Results  Component Value Date   ALT 54 08/12/2015   AST 50* 08/12/2015   ALKPHOS 183* 08/12/2015     Microbiology: Recent Results (from the past 240 hour(s))  Body fluid culture     Status: None   Collection Time: 08/11/15  4:24 PM  Result Value Ref Range Status   Specimen Description SYNOVIAL LEFT SHOULDER  Final   Special Requests SPECIMEN A PT ON ANCEF  Final   Gram Stain   Final    ABUNDANT WBC PRESENT, PREDOMINANTLY PMN NO ORGANISMS SEEN    Culture   Final    STAPHYLOCOCCUS AUREUS CRITICAL RESULT CALLED TO, READ BACK BY AND VERIFIED WITH: P QUSSOUR 08/12/15 @ 1424 M VESTAL    Report Status 08/14/2015 FINAL  Final   Organism ID, Bacteria STAPHYLOCOCCUS AUREUS  Final      Susceptibility   Staphylococcus aureus - MIC*    CIPROFLOXACIN <=0.5 SENSITIVE Sensitive     ERYTHROMYCIN 0.5 SENSITIVE Sensitive     GENTAMICIN <=0.5 SENSITIVE Sensitive     OXACILLIN 0.5 SENSITIVE Sensitive     TETRACYCLINE <=1 SENSITIVE Sensitive     VANCOMYCIN <=0.5 SENSITIVE Sensitive     TRIMETH/SULFA <=10  SENSITIVE Sensitive     CLINDAMYCIN <=0.25 SENSITIVE Sensitive     RIFAMPIN <=0.5 SENSITIVE Sensitive     Inducible Clindamycin NEGATIVE Sensitive     * STAPHYLOCOCCUS AUREUS  Surgical pcr screen     Status: Abnormal   Collection Time: 08/13/15  5:27 AM  Result Value Ref Range Status   MRSA, PCR NEGATIVE NEGATIVE Final   Staphylococcus aureus POSITIVE (A) NEGATIVE Final    Comment:        The Xpert SA Assay (FDA approved for NASAL specimens in patients over 56 years of age), is one component of a comprehensive surveillance program.  Test performance has been validated by [facility] for patients greater than or equal to 66 year old. It is not intended to diagnose infection nor to guide or monitor treatment.   Culture, blood (routine x 2)     Status: None (Preliminary result)   Collection Time: 08/13/15  5:35 AM  Result Value Ref Range Status   Specimen Description BLOOD LEFT WRIST  Final   Special Requests BOTTLES DRAWN AEROBIC AND ANAEROBIC 10CC  Final   Culture NO GROWTH 1 DAY  Final   Report Status PENDING  Incomplete  Culture, blood (routine x 2)     Status: None (Preliminary result)   Collection  Time: 08/13/15  5:40 AM  Result Value Ref Range Status   Specimen Description BLOOD LEFT HAND  Final   Special Requests BOTTLES DRAWN AEROBIC AND ANAEROBIC 5CC  Final   Culture NO GROWTH 1 DAY  Final   Report Status PENDING  Incomplete    Impression:  1. MSSA septic arthritis of shoulder 2. MSSA septic arthritis of knee  Plan: 1. 4 weeks of IV cefazolin 2. Echo today to assure no obvious concerns of endocarditis 3. If TTE negative, no indication for TEE since he will required long term antibiotics regardless 4.  Will need weekly cbc, cmp to [facility] [phone] 5. We will arrange follow up in 3-4 weeks 6. Antibiotics per home health protocol through Nov 30th

## 2015-08-15 NOTE — Care Management Note (Signed)
Case Management Note  Patient Details  Name: Osa CraverJeremy D Bloomfield MRN: [id number] Date of Birth: 1971/10/01  Subjective/Objective:                    Action/Plan:    Expected Discharge Date:                  Expected Discharge Plan:  Home w Home Health Services  In-House Referral:     Discharge planning Services  CM Consult  Post Acute Care Choice:  Home Health, Durable Medical Equipment Choice offered to:     DME Arranged:  IV pump/equipment DME Agency:  [facility]  HH Arranged:  RN, IV Antibiotics, PT, OT HH Agency:  [facility]  Status of Service:  In process, will continue to follow  Medicare Important Message Given:    Date Medicare IM Given:    Medicare IM give by:    Date Additional Medicare IM Given:    Additional Medicare Important Message give by:     If discussed at Long Length of Stay Meetings, dates discussed:    Additional Comments:  Durenda GuthrieBrady, Webber Michiels Naomi, RN 08/15/2015, 11:16 AM

## 2015-08-15 NOTE — Progress Notes (Signed)
*  PRELIMINARY RESULTS* Echocardiogram 2D Echocardiogram has been performed.  Janalyn HarderWest, Burns Timson R 08/15/2015, 11:06 AM

## 2015-08-15 NOTE — Evaluation (Signed)
Occupational Therapy Evaluation Patient Details Name: Raymond CraverJeremy D Peterson MRN: [id number] DOB: Feb 06, 1971 Today's Date: 08/15/2015    History of Present Illness Pt is a 44 y/o M admitted w/ 2 wks of increasing Lt knee and Lt shoulder pain.  Pt w/ infected Lt shoulder and knee.  Pt has massive Lt rotator cuff tear now s/p LSA, labral debridement, chondroplasty, synovectomy, busectomy, LOA, removal of retained suture material.  In addition, pt is s/p Lt knee arthroscopic lavage and extensive synovectomy.     Clinical Impression   Pt reports that he required assist with bathing and dressing PTA due to severe rotator cuff tear in L shoulder that occurred in October. Session limited due to fatigue and pain this AM; pt declined OOB activities despite max verbal encouragement. Pt planning to d/c home with 24/7 supervision from his wife. Pt denied from CIR this morning, therefore, recommending HHOT as follow up in order to maximize independence and safety with ADLs and functional mobility. Pt would benefit from continued skilled OT in order to increase independence and safety with UB ADLs and functional transfers required for safe d/c home.     Follow Up Recommendations  Home health OT;Supervision/Assistance - 24 hour    Equipment Recommendations   (TBD )    Recommendations for Other Services       Precautions / Restrictions Precautions Precautions: Fall Precaution Comments: massive Lt rotator cuff tear Restrictions Weight Bearing Restrictions: Yes LUE Weight Bearing:  (per order: "Ok for adlib motion and use of L UE") LLE Weight Bearing:  (Full WB)      Mobility Bed Mobility Overal bed mobility: Needs Assistance Bed Mobility: Rolling Rolling: Supervision (to R side)            Transfers                 General transfer comment: Not assessed at this time    Balance                                            ADL Overall ADL's : Needs  assistance/impaired                                       General ADL Comments: Pt declined OOB activities despite max verbal encouragement; pt reports he is too tired and in too much pain when he moves. Eduacted on use of LUE for functional activities and ROM as tolerated.       Vision     Perception     Praxis      Pertinent Vitals/Pain Pain Assessment: Faces Faces Pain Scale: Hurts whole lot Pain Location: L knee and L shoulder  Pain Descriptors / Indicators: Aching;Grimacing;Guarding;Sharp Pain Intervention(s): Limited activity within patient's tolerance;Monitored during session     Hand Dominance Right   Extremity/Trunk Assessment Upper Extremity Assessment Upper Extremity Assessment: LUE deficits/detail LUE Deficits / Details: severe L rotator cuff tear. Very limited shoulder PROM (1/4), no AROM. Elbow, wrist, hand ROM WFL, MMT WFL   Lower Extremity Assessment Lower Extremity Assessment: Defer to PT evaluation       Communication Communication Communication: No difficulties   Cognition Arousal/Alertness: Awake/alert Behavior During Therapy: Flat affect Overall Cognitive Status: Within Functional Limits for tasks assessed  General Comments       Exercises       Shoulder Instructions      Home Living Family/patient expects to be discharged to:: Private residence Living Arrangements: Spouse/significant other;Children Available Help at Discharge: Family;Available 24 hours/day Type of Home: House Home Access: Ramped entrance     Home Layout: One level     Bathroom Shower/Tub: Tub/shower unit Shower/tub characteristics: Engineer, building services: Standard     Home Equipment: Crutches;Wheelchair - manual          Prior Functioning/Environment Level of Independence: Needs assistance  Gait / Transfers Assistance Needed: Used crutch in Rt UE 2/2 Lt shoulder pain ADL's / Homemaking Assistance Needed: Needed  assist w/ bathing and dressing from wife        OT Diagnosis: Generalized weakness;Acute pain   OT Problem List: Decreased strength;Decreased range of motion;Decreased activity tolerance;Impaired balance (sitting and/or standing);Decreased safety awareness;Decreased knowledge of use of DME or AE;Decreased knowledge of precautions;Impaired UE functional use;Pain   OT Treatment/Interventions: Self-care/ADL training;Therapeutic exercise;DME and/or AE instruction;Patient/family education    OT Goals(Current goals can be found in the care plan section) Acute Rehab OT Goals Patient Stated Goal: get some more sleep OT Goal Formulation: With patient Time For Goal Achievement: 08/29/15 Potential to Achieve Goals: Good ADL Goals Pt Will Perform Grooming: with supervision;standing Pt Will Perform Upper Body Bathing: with supervision;sitting Pt Will Perform Upper Body Dressing: with supervision;sitting Pt Will Transfer to Toilet: with supervision;ambulating;bedside commode (over toilet) Pt Will Perform Toileting - Clothing Manipulation and hygiene: with supervision;sit to/from stand Pt Will Perform Tub/Shower Transfer: with supervision;ambulating;3 in 1  OT Frequency: Min 2X/week   Barriers to D/C:            Co-evaluation              End of Session    Activity Tolerance: Patient limited by fatigue;Patient limited by pain Patient left: in bed;with call bell/phone within reach   Time: 0925-0938 OT Time Calculation (min): 13 min Charges:  OT General Charges $OT Visit: 1 Procedure OT Evaluation $Initial OT Evaluation Tier I: 1 Procedure G-Codes:     Gaye Alken M.S., OTR/L Pager: [phone]  08/15/2015, 9:50 AM

## 2015-08-15 NOTE — Progress Notes (Signed)
Rehab Admissions Coordinator Note:  Patient was screened by Trish MageLogue, Mykala Mccready M for appropriateness for an Inpatient Acute Rehab Consult.  At this time, we are recommending HH.  He is already doing too well to meet criteria for acute inpatient rehab admission.    Trish MageLogue, Roshini Fulwider M 08/15/2015, 7:28 AM  I can be reached at [phone].

## 2015-08-15 NOTE — Progress Notes (Signed)
Physical Therapy Treatment Patient Details Name: Raymond Raymond Peterson D Raymond Peterson MRN: [id number] DOB: Dec 31, 1970 Today's Date: 08/15/2015    History of Present Illness Pt is a 44 y/o M admitted w/ 2 wks of increasing Lt knee and Lt shoulder pain.  Pt w/ infected Lt shoulder and knee.  Pt has massive Lt rotator cuff tear now s/p LSA, labral debridement, chondroplasty, synovectomy, busectomy, LOA, removal of retained suture material.  In addition, pt is s/p Lt knee arthroscopic lavage and extensive synovectomy.      PT Comments    Patient progressing slowly with mobility in PT. Patient able to ambulate 50 feet with a rw but requiring extensive time to perform. Patient remains very guarded with moving Lt UE and LE despite encouragement to use during session. Will continue to progress mobility to allow for safe transition to home with family support.   Follow Up Recommendations  Home health PT;Supervision for mobility/OOB     Equipment Recommendations  Rolling walker with 5" wheels    Recommendations for Other Services       Precautions / Restrictions Precautions Precautions: Fall Precaution Comments: massive Lt rotator cuff tear Restrictions Weight Bearing Restrictions: Yes LUE Weight Bearing: Weight bearing as tolerated LLE Weight Bearing: Weight bearing as tolerated    Mobility  Bed Mobility Overal bed mobility: Needs Assistance Bed Mobility: Supine to Sit     Supine to sit: Supervision;HOB elevated (approx. 20 degrees)     General bed mobility comments: slow mobility getting out of bed, hooking LLE with RLE  Transfers Overall transfer level: Needs assistance Equipment used: Rolling walker (2 wheeled) Transfers: Sit to/from Stand Sit to Stand: Min assist         General transfer comment: stable transfer, single attempt  Ambulation/Gait Ambulation/Gait assistance: Min guard Ambulation Distance (Feet): 50 Feet Assistive device: Rolling walker (2 wheeled) Gait Pattern/deviations:  Step-to pattern;Decreased stance time - left;Decreased weight shift to left Gait velocity: very slow pattern.    General Gait Details: Very slow pattern, sliding Rt LE forward during Lt LE stance phase, bearing weight through bilateral UEs.    Stairs            Wheelchair Mobility    Modified Rankin (Stroke Patients Only)       Balance Overall balance assessment: Needs assistance Sitting-balance support: No upper extremity supported Sitting balance-Leahy Scale: Fair     Standing balance support: Bilateral upper extremity supported Standing balance-Leahy Scale: Poor Standing balance comment: using rw                    Cognition Arousal/Alertness: Awake/alert Behavior During Therapy: Flat affect Overall Cognitive Status: Within Functional Limits for tasks assessed                      Exercises      General Comments General comments (skin integrity, edema, etc.): Encouraging weightbearing through LLE as well as active motion during gait.       Pertinent Vitals/Pain Pain Assessment: Faces Faces Pain Scale: Hurts whole lot Pain Location: Lt knee Pain Descriptors / Indicators: Grimacing;Guarding;Moaning Pain Intervention(s): Monitored during session;Limited activity within patient's tolerance    Home Living                      Prior Function            PT Goals (current goals can now be found in the care plan section) Acute Rehab PT Goals Patient Stated Goal:  have less pain PT Goal Formulation: With patient/family Time For Goal Achievement: 08/27/15 Potential to Achieve Goals: Good Progress towards PT goals: Progressing toward goals    Frequency  Min 5X/week    PT Plan Discharge plan needs to be updated - home with family support    Co-evaluation             End of Session Equipment Utilized During Treatment: Gait belt Activity Tolerance: Patient limited by fatigue;Patient limited by pain Patient left: in chair;with  call bell/phone within reach     Time: 1134-1205 PT Time Calculation (min) (ACUTE ONLY): 31 min  Charges:  $Gait Training: 23-37 mins                    G Codes:      Christiane Ha, PT, CSCS Pager [phone] Office [phone]  08/15/2015, 4:29 PM

## 2015-08-15 NOTE — Progress Notes (Signed)
Raymond CraverJeremy D Peterson  MRN: [id number] DOB/Age: [birth year] 44 y.o. Physician: Lynnea MaizesK Ayce Pietrzyk, M.D. 2 Days Post-Op Procedure(s) (LRB): Arthroscopic Washout Of Left Shoulder (Left)  Subjective: Reports continued shoulder and knee pain, modest improvements from yesterday. Vital Signs Temp:  [99.6 F (37.6 C)-100.8 F (38.2 C)] 99.6 F (37.6 C) (11/07 0744) Pulse Rate:  [106-116] 110 (11/07 0744) Resp:  [16] 16 (11/07 0744) BP: (142-151)/(64-72) 146/65 mmHg (11/07 0744) SpO2:  [93 %-96 %] 96 % (11/07 0744)  Lab Results  Recent Labs  08/14/15 0441 08/15/15 0653  WBC 11.3* 10.1  HGB 12.0* 11.6*  HCT 37.5* 36.0*  PLT 396 341   BMET No results for input(s): NA, K, CL, CO2, GLUCOSE, BUN, CREATININE, CALCIUM in the last 72 hours. INR  Date Value Ref Range Status  08/11/2015 1.28 0.00 - 1.49 Final     Exam  Left leg swollen from ace bandge, negative homans, no cords, fair ankle motion, portals ok. Left shoulder dressings dry, guarded motion. Plan Dressing change shoulder and knee TED hose left leg OT/PT for mobilization, ROM shoulder and knee. OK for WBAT L shoulder and knee, ad lib use LUE and LLE Will monitor progress and hopefull for D/C HOME WITH hhpt AND iv ABX,but may require short term  snf  Jacaden Forbush M Alem Fahl 08/15/2015, 12:45 PM    Contact # [phone]

## 2015-08-15 NOTE — Progress Notes (Signed)
Encouraged pt to increase fluid/food intake.  Urine very dark, no bm since sx. pts intake is poor.

## 2015-08-16 LAB — CBC WITH DIFFERENTIAL/PLATELET
Basophils Absolute: 0 10*3/uL (ref 0.0–0.1)
Basophils Relative: 0 %
Eosinophils Absolute: 0.2 10*3/uL (ref 0.0–0.7)
Eosinophils Relative: 2 %
HCT: 35 % — ABNORMAL LOW (ref 39.0–52.0)
Hemoglobin: 11.8 g/dL — ABNORMAL LOW (ref 13.0–17.0)
Lymphocytes Relative: 8 %
Lymphs Abs: 0.7 10*3/uL (ref 0.7–4.0)
MCH: 31.3 pg (ref 26.0–34.0)
MCHC: 33.7 g/dL (ref 30.0–36.0)
MCV: 92.8 fL (ref 78.0–100.0)
Monocytes Absolute: 0.6 10*3/uL (ref 0.1–1.0)
Monocytes Relative: 6 %
Neutro Abs: 8.2 10*3/uL — ABNORMAL HIGH (ref 1.7–7.7)
Neutrophils Relative %: 84 %
Platelets: 459 10*3/uL — ABNORMAL HIGH (ref 150–400)
RBC: 3.77 MIL/uL — ABNORMAL LOW (ref 4.22–5.81)
RDW: 13.1 % (ref 11.5–15.5)
WBC: 9.7 10*3/uL (ref 4.0–10.5)

## 2015-08-16 MED ORDER — METHOCARBAMOL 500 MG PO TABS
500.0000 mg | ORAL_TABLET | Freq: Three times a day (TID) | ORAL | Status: DC | PRN
  Administered 2015-08-16: 500 mg via ORAL
  Filled 2015-08-16: qty 1

## 2015-08-16 MED ORDER — METHOCARBAMOL 500 MG PO TABS: 500.0000 mg | ORAL_TABLET | Freq: Three times a day (TID) | ORAL | Status: DC | PRN

## 2015-08-16 MED ORDER — ZOLPIDEM TARTRATE 10 MG PO TABS: 10.0000 mg | ORAL_TABLET | Freq: Every evening | ORAL | Status: DC | PRN

## 2015-08-16 MED ORDER — HEPARIN SOD (PORK) LOCK FLUSH 100 UNIT/ML IV SOLN
250.0000 [IU] | INTRAVENOUS | Status: AC | PRN
  Administered 2015-08-16: 250 [IU]

## 2015-08-16 NOTE — Discharge Summary (Signed)
PATIENT ID:      Raymond Peterson  MRN:     [id number] DOB/AGE:    1971-04-16 / 44 y.o.     DISCHARGE SUMMARY  ADMISSION DATE:    08/11/2015 DISCHARGE DATE:    ADMISSION DIAGNOSIS: Shoulder Infection Past Medical History  Diagnosis Date  . PONV (postoperative nausea and vomiting)     "just once"  . 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  HOSPITAL COURSE:  Raymond CraverJeremy D Kouns is a 44 y.o. admitted on 08/11/2015 with a chief complaint of severe left knee pain and aspirate in office confirming infectious source. diagnosis of Shoulder Infection.  They were brought to the operating room on 08/11/2015 - 08/13/2015 and underwent left knee arthroscopic lavage and while under anesthesia we performed aspirate of his left shoulder which was injured on the job several days prior to admission and evaluation in office. It also grew out bacteria later found to be MSSA and required second procedure to wash out his shoulder. He was kept inpatient for PICC line placement and allowing cultures to mature to dictate antibiotic course. On todays date he was stable, even though still painful and home health arrangements were made to continue treatment on outpatient basis.     They were given perioperative antibiotics: Anti-infectives    Start     Dose/Rate Route Frequency Ordered Stop   08/15/15 0000  ceFAZolin (ANCEF) 2-3 GM-% SOLR    Comments:  Dosing and length per infectious disease   2 g 100 mL/hr over 30 Minutes Intravenous Every 8 hours 08/15/15 1152     08/13/15 1500  ceFAZolin (ANCEF) IVPB 2 g/50 mL premix     2 g 100 mL/hr over 30 Minutes Intravenous Every 8 hours 08/13/15 0337     08/13/15 0700  ceFAZolin (ANCEF)  IVPB 2 g/50 mL premix     2 g 100 mL/hr over 30 Minutes Intravenous To Surgery 08/13/15 0327 08/13/15 0755   08/11/15 2200  ceFAZolin (ANCEF) IVPB 2 g/50 mL premix  Status:  Discontinued     2 g 100 mL/hr over 30 Minutes Intravenous 3 times per day 08/11/15 1855 08/13/15 0336   08/11/15 2030  ceFAZolin (ANCEF) IVPB 2 g/50 mL premix  Status:  Discontinued     2 g 100 mL/hr over 30 Minutes Intravenous Every 6 hours 08/11/15 1720 08/11/15 1855   08/11/15 0848  ceFAZolin (ANCEF) 2-3 GM-% IVPB SOLR    Comments:  Forte, Lindsi   : cabinet override      08/11/15 0848 08/11/15 2059   08/11/15 0845  ceFAZolin (ANCEF) IVPB 2 g/50 mL premix     2 g 100 mL/hr over 30 Minutes Intravenous On call to O.R. 08/11/15 [phone] 08/11/15 1431    .      DIAGNOSTIC STUDIES:  RECENT RADIOGRAPHIC STUDIES :  Dg Chest Port 1 View  08/12/2015  CLINICAL DATA:  Right PICC line insertion, left knee infection, access for long-term antibiotics EXAM: PORTABLE CHEST 1 VIEW COMPARISON:  None. FINDINGS: Right PICC line mid SVC level. Mild cardiac enlargement and prominent hilar vascularity. Minimal basilar atelectasis. No focal pneumonia, collapse or consolidation. No edema, effusion or pneumothorax. Trachea midline. No acute  osseous finding. Monitor leads overlie the chest. IMPRESSION: Right PICC line tip mid SVC level. Cardiomegaly without CHF Basilar atelectasis Electronically Signed   By: Judie Petit.  Shick M.D.   On: 08/12/2015 08:56    RECENT VITAL SIGNS:  Patient Vitals for the past 24 hrs:  BP Temp Temp src Pulse Resp SpO2  08/16/15 0502 (!) 149/71 mmHg 99.5 F (37.5 C) Oral (!) 104 18 96 %  08/15/15 2233 (!) 144/71 mmHg 99.1 F (37.3 C) Oral (!) 103 17 97 %  08/15/15 1400 (!) 146/71 mmHg 99.4 F (37.4 C) - (!) 101 18 95 %  .  RECENT EKG RESULTS:   No orders found for this or any previous visit.  DISCHARGE INSTRUCTIONS:  Discharge Instructions    Remove sutures    Complete by:  As directed   To left knee this  afternoon before discharge home ACE wrap to left knee           DISCHARGE MEDICATIONS:     Medication List    TAKE these medications        ceFAZolin 2-3 GM-% Solr  Commonly known as:  ANCEF  Inject 50 mLs (2 g total) into the vein every 8 (eight) hours.     diazepam 5 MG tablet  Commonly known as:  VALIUM  Take 1 tablet (5 mg total) by mouth every 6 (six) hours as needed for anxiety, muscle spasms or sedation.     methocarbamol 500 MG tablet  Commonly known as:  ROBAXIN  Take 1 tablet (500 mg total) by mouth every 8 (eight) hours as needed for muscle spasms.     oxyCODONE-acetaminophen 5-325 MG tablet  Commonly known as:  PERCOCET  Take 1-2 tablets by mouth every 4 (four) hours as needed.     zolpidem 10 MG tablet  Commonly known as:  AMBIEN  Take 1 tablet (10 mg total) by mouth at bedtime as needed for sleep.        FOLLOW UP VISIT:       Follow-up Information    Follow up with [facility].   Why:  You will be contacted by someone from [facility] to arrange start time for RN visit and therapist.    Contact information:   [address] [place] [address] [phone]       Follow up with Vania Rea SUPPLE, MD.   Specialty:  Orthopedic Surgery   Why:  call to be seen next wednesday   Contact information:   [address] [place] [address] [phone]       DISCHARGE TO: Home  DISPOSITION: Good  DISCHARGE CONDITION:  Stable   Aynslee Mulhall for Dr. Francena Hanly 08/16/2015, 10:46 AM

## 2015-08-16 NOTE — Progress Notes (Signed)
Raymond CraverJeremy D Peterson discharged home per MD order. Discharge instructions reviewed and discussed with patient. All questions and concerns answered. Copy of instructions and scripts given to patient. PICC locked for home care.  Patient escorted to car by staff in a wheelchair. No distress noted upon discharge.   Raymond FireScott, Raymond Peterson 08/16/2015 4:32 PM

## 2015-08-16 NOTE — Progress Notes (Signed)
Physical Therapy Treatment Patient Details Name: Raymond Peterson MRN: [id number] DOB: 15-Jul-1971 Today's Date: 08/16/2015    History of Present Illness Pt is a 44 y/o M admitted w/ 2 wks of increasing Lt knee and Lt shoulder pain.  Pt w/ infected Lt shoulder and knee.  Pt has massive Lt rotator cuff tear now s/p LSA, labral debridement, chondroplasty, synovectomy, busectomy, LOA, removal of retained suture material.  In addition, pt is s/p Lt knee arthroscopic lavage and extensive synovectomy.      PT Comments    Patient is making slow progress with PT regarding mobility. Continuing to encourage increased active use of LLE with exercises and mobility. Anticipate patient will DC home with family assistance. Patient reporting that he is fine with going home if that's the best decision. Patient denies any questions or concerns at this time.      Follow Up Recommendations  Home health PT;Supervision for mobility/OOB     Equipment Recommendations  Rolling walker with 5" wheels    Recommendations for Other Services       Precautions / Restrictions Precautions Precautions: Fall Precaution Comments: massive Lt rotator cuff tear Restrictions Weight Bearing Restrictions: Yes LUE Weight Bearing: Weight bearing as tolerated LLE Weight Bearing: Weight bearing as tolerated    Mobility  Bed Mobility Overal bed mobility: Needs Assistance Bed Mobility: Supine to Sit     Supine to sit: Supervision     General bed mobility comments: using Rt LE to assist with moving LLE out of bed.   Transfers Overall transfer level: Needs assistance Equipment used: Rolling walker (2 wheeled) Transfers: Sit to/from Stand Sit to Stand: Min guard         General transfer comment: safe technique utilized.   Ambulation/Gait Ambulation/Gait assistance: Min guard Ambulation Distance (Feet): 50 Feet Assistive device: Rolling walker (2 wheeled) Gait Pattern/deviations: Step-to pattern Gait velocity:  very slow pattern.    General Gait Details: slow pattern, advancing LLE without assistance, poor Rt foot clearance when advancing. No loss of balance.    Stairs Stairs:  (states he has ramped entrance)          Wheelchair Mobility    Modified Rankin (Stroke Patients Only)       Balance Overall balance assessment: Needs assistance Sitting-balance support: No upper extremity supported Sitting balance-Leahy Scale: Fair     Standing balance support: Bilateral upper extremity supported Standing balance-Leahy Scale: Poor Standing balance comment: using rw                    Cognition Arousal/Alertness: Awake/alert Behavior During Therapy: Flat affect Overall Cognitive Status: Within Functional Limits for tasks assessed                      Exercises General Exercises - Lower Extremity Ankle Circles/Pumps: AROM;Both;10 reps Quad Sets: Left;10 reps;Strengthening Gluteal Sets: Strengthening;10 reps;Both Heel Slides: AAROM;Left;10 reps Hip ABduction/ADduction: AAROM;Left;10 reps    General Comments General comments (skin integrity, edema, etc.): Encouraging weight bearing through LLE with ambulation. Additionally encouraging patient to progress ambulation distance for imporved mobility at home.       Pertinent Vitals/Pain Pain Assessment: 0-10 Pain Score: 5  Pain Location: Lt knee Pain Descriptors / Indicators:  (pain) Pain Intervention(s): Monitored during session;Limited activity within patient's tolerance    Home Living                      Prior Function  PT Goals (current goals can now be found in the care plan section) Acute Rehab PT Goals Patient Stated Goal: be able to get up and walk again.  PT Goal Formulation: With patient/family Time For Goal Achievement: 08/27/15 Potential to Achieve Goals: Good Progress towards PT goals: Progressing toward goals    Frequency  Min 5X/week    PT Plan Current plan remains  appropriate    Co-evaluation             End of Session Equipment Utilized During Treatment: Gait belt Activity Tolerance: Patient limited by fatigue Patient left: in chair;with call bell/phone within reach     Time: 0931-1007 PT Time Calculation (min) (ACUTE ONLY): 36 min  Charges:  $Gait Training: 8-22 mins $Therapeutic Exercise: 8-22 mins                    G Codes:      Christiane HaBenjamin J. Numan Zylstra, PT, CSCS Pager [phone] Office [phone]  08/16/2015, 11:24 AM

## 2015-08-16 NOTE — Progress Notes (Signed)
OT Cancellation Note  Patient Details Name: Osa CraverJeremy D Vega MRN: [id number] DOB: April 25, 1971   Cancelled Treatment:    Reason Eval/Treat Not Completed: Other (comment) (Pt about to discharge and not agreeable to OT session-reports he is tired.)  Earlie RavelingStraub, Azim Gillingham L OTR/L [phone] 08/16/2015, 4:25 PM

## 2015-08-16 NOTE — Progress Notes (Signed)
[facility]  Patient Status: New IV patient for home IV ABX.  [facility] is providing the following services: HHRN and home infusion pharmacy for home IV ABX. In hospital teaching provided with patients parents who did very well. [facility] for next dose and work with wife on teaching for independence with  IV ABX at home.    If patient discharges after hours, please call [phone].   Sedalia Mutaamela S Chandler 08/16/2015, 4:00 PM

## 2015-08-18 LAB — CULTURE, BLOOD (ROUTINE X 2)
CULTURE: NO GROWTH
Culture: NO GROWTH

## 2015-08-19 ENCOUNTER — Emergency Department (HOSPITAL_COMMUNITY): Payer: BLUE CROSS/BLUE SHIELD

## 2015-08-19 ENCOUNTER — Encounter (HOSPITAL_COMMUNITY): Payer: Self-pay

## 2015-08-19 ENCOUNTER — Emergency Department (HOSPITAL_BASED_OUTPATIENT_CLINIC_OR_DEPARTMENT_OTHER): Payer: BLUE CROSS/BLUE SHIELD

## 2015-08-19 ENCOUNTER — Observation Stay (HOSPITAL_COMMUNITY)
Admission: EM | Admit: 2015-08-19 | Discharge: 2015-08-20 | Disposition: A | Discharge status: Home / Self Care | Payer: BLUE CROSS/BLUE SHIELD | Attending: Infectious Diseases | Admitting: Infectious Diseases

## 2015-08-19 DIAGNOSIS — Z9889 Other specified postprocedural states: Secondary | ICD-10-CM | POA: Diagnosis not present

## 2015-08-19 DIAGNOSIS — I824Y2 Acute embolism and thrombosis of unspecified deep veins of left proximal lower extremity: Secondary | ICD-10-CM | POA: Diagnosis present

## 2015-08-19 DIAGNOSIS — R7989 Other specified abnormal findings of blood chemistry: Secondary | ICD-10-CM

## 2015-08-19 DIAGNOSIS — E43 Unspecified severe protein-calorie malnutrition: Secondary | ICD-10-CM | POA: Diagnosis not present

## 2015-08-19 DIAGNOSIS — I82442 Acute embolism and thrombosis of left tibial vein: Principal | ICD-10-CM | POA: Insufficient documentation

## 2015-08-19 DIAGNOSIS — M79609 Pain in unspecified limb: Secondary | ICD-10-CM | POA: Diagnosis not present

## 2015-08-19 DIAGNOSIS — M00012 Staphylococcal arthritis, left shoulder: Secondary | ICD-10-CM | POA: Insufficient documentation

## 2015-08-19 DIAGNOSIS — E46 Unspecified protein-calorie malnutrition: Secondary | ICD-10-CM

## 2015-08-19 DIAGNOSIS — E8809 Other disorders of plasma-protein metabolism, not elsewhere classified: Secondary | ICD-10-CM

## 2015-08-19 DIAGNOSIS — R945 Abnormal results of liver function studies: Secondary | ICD-10-CM | POA: Diagnosis not present

## 2015-08-19 DIAGNOSIS — M009 Pyogenic arthritis, unspecified: Secondary | ICD-10-CM

## 2015-08-19 DIAGNOSIS — I8290 Acute embolism and thrombosis of unspecified vein: Secondary | ICD-10-CM | POA: Diagnosis present

## 2015-08-19 DIAGNOSIS — M00062 Staphylococcal arthritis, left knee: Secondary | ICD-10-CM | POA: Insufficient documentation

## 2015-08-19 DIAGNOSIS — I82402 Acute embolism and thrombosis of unspecified deep veins of left lower extremity: Secondary | ICD-10-CM | POA: Diagnosis not present

## 2015-08-19 DIAGNOSIS — G4700 Insomnia, unspecified: Secondary | ICD-10-CM

## 2015-08-19 DIAGNOSIS — B9561 Methicillin susceptible Staphylococcus aureus infection as the cause of diseases classified elsewhere: Secondary | ICD-10-CM | POA: Diagnosis not present

## 2015-08-19 DIAGNOSIS — M7989 Other specified soft tissue disorders: Secondary | ICD-10-CM

## 2015-08-19 LAB — COMPREHENSIVE METABOLIC PANEL
ALBUMIN: 2 g/dL — AB (ref 3.5–5.0)
ALK PHOS: 261 U/L — AB (ref 38–126)
ALT: 60 U/L (ref 17–63)
AST: 83 U/L — AB (ref 15–41)
Anion gap: 11 (ref 5–15)
BUN: 15 mg/dL (ref 6–20)
CALCIUM: 8.7 mg/dL — AB (ref 8.9–10.3)
CHLORIDE: 102 mmol/L (ref 101–111)
CO2: 22 mmol/L (ref 22–32)
CREATININE: 0.74 mg/dL (ref 0.61–1.24)
GFR calc Af Amer: >60 mL/min (ref >60)
GFR calc non Af Amer: >60 mL/min (ref >60)
GLUCOSE: 197 mg/dL — AB (ref 65–99)
Potassium: 4.1 mmol/L (ref 3.5–5.1)
SODIUM: 135 mmol/L (ref 135–145)
Total Bilirubin: 0.9 mg/dL (ref 0.3–1.2)
Total Protein: 7.1 g/dL (ref 6.5–8.1)

## 2015-08-19 LAB — CBC WITH DIFFERENTIAL/PLATELET
BASOS PCT: 0 %
Basophils Absolute: 0 10*3/uL (ref 0.0–0.1)
EOS ABS: 0.1 10*3/uL (ref 0.0–0.7)
Eosinophils Relative: 1 %
HEMATOCRIT: 39.6 % (ref 39.0–52.0)
HEMOGLOBIN: 13 g/dL (ref 13.0–17.0)
LYMPHS ABS: 0.9 10*3/uL (ref 0.7–4.0)
Lymphocytes Relative: 8 %
MCH: 30.3 pg (ref 26.0–34.0)
MCHC: 32.8 g/dL (ref 30.0–36.0)
MCV: 92.3 fL (ref 78.0–100.0)
MONO ABS: 0.7 10*3/uL (ref 0.1–1.0)
MONOS PCT: 7 %
NEUTROS PCT: 84 %
Neutro Abs: 9.2 10*3/uL — ABNORMAL HIGH (ref 1.7–7.7)
Platelets: 410 10*3/uL — ABNORMAL HIGH (ref 150–400)
RBC: 4.29 MIL/uL (ref 4.22–5.81)
RDW: 13.1 % (ref 11.5–15.5)
WBC: 10.9 10*3/uL — ABNORMAL HIGH (ref 4.0–10.5)

## 2015-08-19 LAB — I-STAT CG4 LACTIC ACID, ED
Lactic Acid, Venous: 2.48 mmol/L (ref 0.5–2.0)
Lactic Acid, Venous: 1.06 mmol/L (ref 0.5–2.0)
Lactic Acid, Venous: 1.12 mmol/L (ref 0.5–2.0)

## 2015-08-19 MED ORDER — ZOLPIDEM TARTRATE 5 MG PO TABS: 10.0000 mg | ORAL_TABLET | Freq: Every evening | ORAL | Status: DC | PRN

## 2015-08-19 MED ORDER — OXYCODONE-ACETAMINOPHEN 5-325 MG PO TABS
1.0000 | ORAL_TABLET | ORAL | Status: DC | PRN
  Administered 2015-08-19 – 2015-08-20 (×4): 2 via ORAL
  Filled 2015-08-19 (×4): qty 2

## 2015-08-19 MED ORDER — CEFAZOLIN SODIUM-DEXTROSE 2-3 GM-% IV SOLR
2.0000 g | Freq: Three times a day (TID) | INTRAVENOUS | Status: DC
  Administered 2015-08-19 – 2015-08-20 (×3): 2 g via INTRAVENOUS
  Filled 2015-08-19 (×3): qty 50

## 2015-08-19 MED ORDER — OXYCODONE-ACETAMINOPHEN 5-325 MG PO TABS
1.0000 | ORAL_TABLET | Freq: Once | ORAL | Status: AC
  Administered 2015-08-19: 1 via ORAL
  Filled 2015-08-19: qty 1

## 2015-08-19 MED ORDER — B COMPLEX-C PO TABS
1.0000 | ORAL_TABLET | Freq: Every day | ORAL | Status: DC
  Administered 2015-08-20: 1 via ORAL
  Filled 2015-08-19: qty 1

## 2015-08-19 MED ORDER — SODIUM CHLORIDE 0.9 % IJ SOLN
10.0000 mL | INTRAMUSCULAR | Status: DC | PRN
  Administered 2015-08-20: 10 mL
  Filled 2015-08-19: qty 40

## 2015-08-19 MED ORDER — SODIUM CHLORIDE 0.9 % IJ SOLN: 10.0000 mL | Freq: Two times a day (BID) | INTRAMUSCULAR | Status: DC

## 2015-08-19 MED ORDER — HEPARIN (PORCINE) IN NACL 100-0.45 UNIT/ML-% IJ SOLN
1700.0000 [IU]/h | INTRAMUSCULAR | Status: DC
  Administered 2015-08-19: 1700 [IU]/h via INTRAVENOUS
  Filled 2015-08-19: qty 250

## 2015-08-19 MED ORDER — POLYETHYLENE GLYCOL 3350 17 G PO PACK: 17.0000 g | PACK | Freq: Every day | ORAL | Status: DC | PRN

## 2015-08-19 MED ORDER — ENOXAPARIN SODIUM 120 MG/0.8ML ~~LOC~~ SOLN
1.0000 mg/kg | Freq: Once | SUBCUTANEOUS | Status: AC
  Administered 2015-08-20: 115 mg via SUBCUTANEOUS
  Filled 2015-08-19: qty 0.8

## 2015-08-19 MED ORDER — SODIUM CHLORIDE 0.9 % IV BOLUS (SEPSIS)
1000.0000 mL | Freq: Once | INTRAVENOUS | Status: AC
  Administered 2015-08-19: 1000 mL via INTRAVENOUS

## 2015-08-19 MED ORDER — METHOCARBAMOL 500 MG PO TABS
500.0000 mg | ORAL_TABLET | Freq: Three times a day (TID) | ORAL | Status: DC | PRN
  Filled 2015-08-19: qty 1

## 2015-08-19 MED ORDER — ENOXAPARIN SODIUM 120 MG/0.8ML ~~LOC~~ SOLN: 1.0000 mg/kg | Freq: Two times a day (BID) | SUBCUTANEOUS | Status: DC

## 2015-08-19 MED ORDER — HEPARIN BOLUS VIA INFUSION
5000.0000 [IU] | Freq: Once | INTRAVENOUS | Status: AC
  Administered 2015-08-19: 5000 [IU] via INTRAVENOUS
  Filled 2015-08-19: qty 5000

## 2015-08-19 MED ORDER — SODIUM CHLORIDE 0.9 % IJ SOLN: 3.0000 mL | Freq: Two times a day (BID) | INTRAMUSCULAR | Status: DC

## 2015-08-19 MED ORDER — IOHEXOL 350 MG/ML SOLN
100.0000 mL | Freq: Once | INTRAVENOUS | Status: AC | PRN
  Administered 2015-08-19: 100 mL via INTRAVENOUS

## 2015-08-19 NOTE — Progress Notes (Signed)
ANTICOAGULATION CONSULT NOTE - Initial Consult  Pharmacy Consult for Heparin Indication: DVT  No Known Allergies  Patient Measurements:   Heparin Dosing Weight: 100 kg  Vital Signs: Temp: 98 F (36.7 C) (11/11 1233) Temp Source: Oral (11/11 1233) BP: 142/77 mmHg (11/11 2000) Pulse Rate: 114 (11/11 2000)  Labs:  Recent Labs  08/19/15 1348  HGB 13.0  HCT 39.6  PLT 410*  CREATININE 0.74    Estimated Creatinine Clearance: 152.5 mL/min (by C-G formula based on Cr of 0.74).   Medical History: Past Medical History  Diagnosis Date  . PONV (postoperative nausea and vomiting)     "just once"  . Medical history non-contributory     Medications:   (Not in a hospital admission) Scheduled:  . sodium chloride  10-40 mL Intracatheter Q12H   Infusions:  . ceFAZolin Stopped (08/19/15 1901)    Assessment: 44yo male presents with knee pain. Pharmacy is consulted to dose heparin for DVT. Pt has DVT in posterior tibial and peroneal veins. Hgb 13, Plt 410, sCr 0.74.  Goal of Therapy:  Heparin level 0.3-0.7 units/ml Monitor platelets by anticoagulation protocol: Yes   Plan:  Give 5000 units bolus x 1 Start heparin infusion at 1700 units/hr Check anti-Xa level in 6 hours and daily while on heparin Continue to monitor H&H and platelets  Arlean Hoppingorey M. Newman PiesBall, PharmD Clinical Pharmacist Pager [phone] 08/19/2015,8:18 PM

## 2015-08-19 NOTE — H&P (Addendum)
Date: 08/19/2015               Patient Name:  Raymond Peterson MRN: [id number]  DOB: Jan 08, 1971 Age / Sex: 44 y.o., male   PCP: Pcp Not In System         Medical Service: Internal Medicine Teaching Service         Attending Physician: Dr. Campbell Riches, MD    First Contact: Dr. Berline Lopes, MD Pager: [phone]  Second Contact: Dr. Dellia Nims,  Pager: [phone]       After Hours (After 5p/  First Contact Pager: [phone]  weekends / holidays): Second Contact Pager: [phone]   Chief Complaint: Leg Pain  History of Present Illness:   Raymond Peterson is a 44 year old man with with a history of left rotator cuff repair (Followed by Dr. Onnie Graham from Orthopedics), left glenohumeral arthritic changes and recent hospitalization for two septic joints who presents with worsening left leg pain and swelling. The patient had two arthroscopic lavages on 11/3 and 11/5 for septic joints in the left glenohumeral joint and left knee growing MSSA. He was discharged with a PICC in place to complete a 4 week course of IV cefazolin. On 11/8, when he was discharged, Raymond Peterson noticed that his sock did not quite fit on the left, and he had persistent left knee pain. This continued until 11/11 as his left leg continued to swell and the pain intensified. He is still unable to walk on it. He describes it as starting at the back of his left knee and radiating to his foot. He has been taking 2 tablets oxycodone 5-325 mg q4h and robaxin which has become less effective. He also reports night sweats, which he reports "have been going on for months." Otherwise, he denies any chills, headache, shortness of breath, chest pain, heart palpitations, cough, nausea, vomiting, diarrhea, constipation, pain with urination, or new one-sided weakness. He does not have any family history of clotting disorders. He is a non-smoker, does not use steroids,  and does not use illicit drugs.   In the ED, he was afebrile with a WBC of 10.9 (NAb  9.2). He had a lactic acid of 2.48 that has since normalized. Dr. Onnie Graham, his orthopedist was informed and did not recommend any intervention from an orthopedic standpoint. Lower extremity US showing DVT involving the posterior tibial and peroneal veins. Given tachycardia (103-124), a CT Angiogram was obtained which was indeterminate. He was started on a heparin infusion.  Meds: Current Facility-Administered Medications  Medication Dose Route Frequency Provider Last Rate Last Dose  . ceFAZolin (ANCEF) IVPB 2 g/50 mL premix  2 g Intravenous 3 times per day Harvel Quale, MD   Stopped at 08/19/15 1901  . oxyCODONE-acetaminophen (PERCOCET/ROXICET) 5-325 MG per tablet 1-2 tablet  1-2 tablet Oral Q4H PRN Francesca Oman, DO      . sodium chloride 0.9 % injection 10-40 mL  10-40 mL Intracatheter Q12H Harvel Quale, MD      . sodium chloride 0.9 % injection 10-40 mL  10-40 mL Intracatheter PRN Harvel Quale, MD        Allergies: Allergies as of 08/19/2015  . (No Known Allergies)   Past Medical History  Diagnosis Date  . PONV (postoperative nausea and vomiting)     "just once"  . Medical history non-contributory    Past Surgical History  Procedure Laterality Date  . Rotator cuff repair Left   . Knee arthroscopy Right   .  Shoulder arthroscopy Right   . Knee arthroscopy Left 08/11/2015    Procedure: Left Knee Arthroscopic Lavage;  Surgeon: Justice Britain, MD;  Location: [place];  Service: Orthopedics;  Laterality: Left;  . Fine needle aspiration Left 08/11/2015    Procedure: LEFT SHOULDER ASPIRATION;  Surgeon: Justice Britain, MD;  Location: [place];  Service: Orthopedics;  Laterality: Left;  . Shoulder arthroscopy Left 08/13/2015    Procedure: Arthroscopic Washout Of Left Shoulder;  Surgeon: Justice Britain, MD;  Location: [place];  Service: Orthopedics;  Laterality: Left;   No family history on file. Social History   Social History  . Marital Status: Married    Spouse Name: N/A  . Number of  Children: N/A  . Years of Education: N/A   Occupational History  . Not on file.   Social History Main Topics  . Smoking status: Never Smoker   . Smokeless tobacco: Not on file  . Alcohol Use: No  . Drug Use: No  . Sexual Activity: Not on file   Other Topics Concern  . Not on file   Social History Narrative    Review of Systems: Negative Except per HPI  Physical Exam: Blood pressure 135/79, pulse 109, temperature 98 F (36.7 C), temperature source Oral, resp. rate 16, SpO2 93 %. General: Lying in bed, appearing uncomfortable, towel on head HEENT: Moist mucous membranes, no tonsillar exudate or erythema, no cervical adenopathy. PERRL. EOMI Cardiovascular: Tachycardic. Regular rhythm. No Murmurs appreciated Pulmonary: Clear to auscultation in anterior fields Abdomen: Non-tender, non-distended. Normal bowel sounds MSK: Tenderness to palpation with limited range of motion of left shoulder Skin: Small healing incision noted in left knee without purulent drainage or surround erythema.  Extremities: Erythema and increased circumference of left calf relative to right. 1+ pulses in left lower extremity, 2+ in right, perhaps due to swelling Neurological: AAOx3. Tongue midline, face symmetric. Sensation in tact in lower extremities  Lab results: Basic Metabolic Panel:  Recent Labs  08/19/15 1348  NA 135  K 4.1  CL 102  CO2 22  GLUCOSE 197*  BUN 15  CREATININE 0.74  CALCIUM 8.7*   Liver Function Tests:  Recent Labs  08/19/15 1348  AST 83*  ALT 60  ALKPHOS 261*  BILITOT 0.9  PROT 7.1  ALBUMIN 2.0*   No results for input(s): LIPASE, AMYLASE in the last 72 hours. No results for input(s): AMMONIA in the last 72 hours. CBC:  Recent Labs  08/19/15 1348  WBC 10.9*  NEUTROABS 9.2*  HGB 13.0  HCT 39.6  MCV 92.3  PLT 410*    Imaging results:  Ct Angio Chest Pe W/cm &/or Wo Cm  08/19/2015  CLINICAL DATA:  Status post left knee and left shoulder surgery, with  worsening pain, swelling and fever at the left leg. Diagnosed with left leg DVT. Assess for pulmonary embolus. Initial encounter. EXAM: CT ANGIOGRAPHY CHEST WITH CONTRAST TECHNIQUE: Multidetector CT imaging of the chest was performed using the standard protocol during bolus administration of intravenous contrast. Multiplanar CT image reconstructions and MIPs were obtained to evaluate the vascular anatomy. CONTRAST:  122m OMNIPAQUE IOHEXOL 350 MG/ML SOLN COMPARISON:  Chest radiograph performed 08/12/2015 FINDINGS: There is no evidence of central pulmonary embolus. However, there are a number of potential vague filling defects in the periphery of the pulmonary arteries bilaterally. This may simply reflect motion artifact or beam hardening artifact, though segmental pulmonary embolus cannot be excluded on the basis of this study. Minimal left-sided atelectasis is noted. The lungs  are otherwise clear. There is no evidence of significant focal consolidation, pleural effusion or pneumothorax. No masses are identified; no abnormal focal contrast enhancement is seen. The mediastinum is unremarkable in appearance. No mediastinal lymphadenopathy is seen. No pericardial effusion is identified. The great vessels are grossly unremarkable in appearance. No axillary lymphadenopathy is seen. The visualized portions of the thyroid gland are unremarkable in appearance. The visualized portions of the liver and spleen are unremarkable. No acute osseous abnormalities are seen. Review of the MIP images confirms the above findings. IMPRESSION: 1. No evidence of central pulmonary embolus. However, there are number of potential vague filling defects in the periphery of the pulmonary arteries bilaterally; these may simply be artifactual, though segmental pulmonary embolus cannot be excluded on the basis of this study. Depending on the patient's symptoms, a repeat CTA of the chest could be considered. Note that a new peripheral IV catheter  would be required; the patient's existing power PICC limited the rate of injection of contrast on this study, corresponding to the suboptimal results. 2. Minimal left-sided atelectasis noted.  Lungs otherwise clear. Electronically Signed   By: Garald Balding M.D.   On: 08/19/2015 18:33     Assessment & Plan by Problem:  Provoked Deep Vein Thrombosis in Left Lower Extremity without Evidence of Pulmonary Embolus: Recent orthopedic procedures and immobility suggest this is a provoked event. He has no clear family history or other behavioral factors they may have contributed to this event. Therefore, he will require three months of anticoagulation. We brought up the possibility of using NOACs and he reported that he is open to exploring his options. However, he reports that he would like [place] to first treat his overwhelming pain. While heparin was started in the ED, we curbsided a consult with hematologist Dr. Beryle Beams who recommended Lovenox versus heparin in this clinical situation. - Lovenox 115 mg q12h - Oxycodone 5-325 1-2 tablets q4h prn - on bowel regimen of Miralax 17g daily, Colace 100 mg BID - Robaxin 500 mg q8h prn muscle spasms  Septic Joints: He will continue IV ancef via his PICC for two more weeks - Cefazolin IV 2g q8h  Elevated LFTs: Have been persistently elevated since 11/3 with ALP >200. Looks like a cholestatic picture, but patient denies any abdominal pain.  - Consider GGT to elucidate cholestatic causes - Consider HCV ab to query chronic viral hepatitis.  Protein Calorie Malnutrition: Albumin of 2.0 - Ensure  Insomnia: Ambien 10 mg prn   Dispo: Disposition is deferred at this time, awaiting improvement of current medical problems. Anticipated discharge in approximately 2-3 day(s).   The patient does not have a current PCP (Pcp Not In System) and does not need an [facility] hospital follow-up appointment after discharge.  The patient does have transportation limitations that  hinder transportation to clinic appointments.  Signed: Liberty Handy, MD 08/19/2015, 10:46 PM    Date: 08/20/2015  Patient name: Raymond Peterson  Medical record number: [id number]  Date of birth: [birth year]   I have seen and evaluated Raymond Peterson and discussed their care with the Residency Team.   Assessment and Plan: I have seen and evaluated the patient as outlined above. I agree with the formulated Assessment and Plan as detailed in the residents' admission note, with the following changes:  44 yo M with L rotator cuff injury and L shoulder MSSA infection. He underwent I & D of his knee and shoulder on 11-5. He was d/c home 11-8 with [facility] and  IV ancef with plan for 28 days of IV anbx. He noted at home that his L calf no longer fit in his sock. He was brought to ED and was found to have L calf DVT. He was also noted to have tachycardia and underwent CTA which was inconclusive for PE.  He was started on LMWH.   Filed Vitals:   08/19/15 2308  BP: 132/68  Pulse: 120  Temp: 100.2 F (37.9 C)  Resp: 22    CV- rrr Chest- cta abd- bs+, soft non-tender.  LLE- calf grossly swollen with heat. His knee is swollen, wounds are well healed.  RUE PIC is clean, non-tenderness, no cordis.   Recent Results (from the past 2160 hour(s))  APTT     Status: None   Collection Time: 08/11/15  8:53 AM  Result Value Ref Range   aPTT 29 24 - 37 seconds  CBC WITH DIFFERENTIAL     Status: Abnormal   Collection Time: 08/11/15  8:53 AM  Result Value Ref Range   WBC 14.5 (H) 4.0 - 10.5 K/uL   RBC 4.45 4.22 - 5.81 MIL/uL   Hemoglobin 13.9 13.0 - 17.0 g/dL   HCT 41.6 39.0 - 52.0 %   MCV 93.5 78.0 - 100.0 fL   MCH 31.2 26.0 - 34.0 pg   MCHC 33.4 30.0 - 36.0 g/dL   RDW 13.0 11.5 - 15.5 %   Platelets 490 (H) 150 - 400 K/uL   Neutrophils Relative % 88 %   Neutro Abs 12.7 (H) 1.7 - 7.7 K/uL   Lymphocytes Relative 5 %   Lymphs Abs 0.7 0.7 - 4.0 K/uL   Monocytes Relative 7 %   Monocytes Absolute 1.1  (H) 0.1 - 1.0 K/uL   Eosinophils Relative 0 %   Eosinophils Absolute 0.0 0.0 - 0.7 K/uL   Basophils Relative 0 %   Basophils Absolute 0.0 0.0 - 0.1 K/uL  Comprehensive metabolic panel     Status: Abnormal   Collection Time: 08/11/15  8:53 AM  Result Value Ref Range   Sodium 137 135 - 145 mmol/L   Potassium 4.1 3.5 - 5.1 mmol/L   Chloride 97 (L) 101 - 111 mmol/L   CO2 27 22 - 32 mmol/L   Glucose, Bld 123 (H) 65 - 99 mg/dL   BUN 15 6 - 20 mg/dL   Creatinine, Ser 0.89 0.61 - 1.24 mg/dL   Calcium 8.7 (L) 8.9 - 10.3 mg/dL   Total Protein 7.4 6.5 - 8.1 g/dL   Albumin 2.1 (L) 3.5 - 5.0 g/dL   AST 53 (H) 15 - 41 U/L   ALT 68 (H) 17 - 63 U/L   Alkaline Phosphatase 207 (H) 38 - 126 U/L   Total Bilirubin 2.0 (H) 0.3 - 1.2 mg/dL   GFR calc non Af Amer >60 >60 mL/min   GFR calc Af Amer >60 >60 mL/min    Comment: (NOTE) The eGFR has been calculated using the CKD EPI equation. This calculation has not been validated in all clinical situations. eGFR's persistently <60 mL/min signify possible Chronic Kidney Disease.    Anion gap 13 5 - 15  Protime-INR     Status: Abnormal   Collection Time: 08/11/15  8:53 AM  Result Value Ref Range   Prothrombin Time 16.1 (H) 11.6 - 15.2 seconds   INR 1.28 0.00 - 1.49  Synovial cell count + diff, w/ crystals     Status: Abnormal   Collection Time: 08/11/15  4:22 PM  Result Value Ref Range   Color, Synovial RED (A) YELLOW   Appearance-Synovial TURBID (A) CLEAR   Crystals, Fluid NO CRYSTALS SEEN    WBC, Synovial 13500 (H) 0 - 200 /cu mm   Neutrophil, Synovial 98 (H) 0 - 25 %   Lymphocytes-Synovial Fld 1 0 - 20 %   Monocyte-Macrophage-Synovial Fluid 1 (L) 50 - 90 %   Eosinophils-Synovial 0 0 - 1 %  Body fluid culture     Status: None   Collection Time: 08/11/15  4:24 PM  Result Value Ref Range   Specimen Description SYNOVIAL LEFT SHOULDER    Special Requests SPECIMEN A PT ON ANCEF    Gram Stain      ABUNDANT WBC PRESENT, PREDOMINANTLY PMN NO  ORGANISMS SEEN    Culture      STAPHYLOCOCCUS AUREUS CRITICAL RESULT CALLED TO, READ BACK BY AND VERIFIED WITH: P QUSSOUR 08/12/15 @ 1424 M VESTAL    Report Status 08/14/2015 FINAL    Organism ID, Bacteria STAPHYLOCOCCUS AUREUS       Susceptibility   Staphylococcus aureus - MIC*    CIPROFLOXACIN <=0.5 SENSITIVE Sensitive     ERYTHROMYCIN 0.5 SENSITIVE Sensitive     GENTAMICIN <=0.5 SENSITIVE Sensitive     OXACILLIN 0.5 SENSITIVE Sensitive     TETRACYCLINE <=1 SENSITIVE Sensitive     VANCOMYCIN <=0.5 SENSITIVE Sensitive     TRIMETH/SULFA <=10 SENSITIVE Sensitive     CLINDAMYCIN <=0.25 SENSITIVE Sensitive     RIFAMPIN <=0.5 SENSITIVE Sensitive     Inducible Clindamycin NEGATIVE Sensitive     * STAPHYLOCOCCUS AUREUS  CBC with Differential/Platelet     Status: Abnormal   Collection Time: 08/12/15  3:37 AM  Result Value Ref Range   WBC 11.0 (H) 4.0 - 10.5 K/uL   RBC 3.92 (L) 4.22 - 5.81 MIL/uL   Hemoglobin 12.0 (L) 13.0 - 17.0 g/dL   HCT 37.0 (L) 39.0 - 52.0 %   MCV 94.4 78.0 - 100.0 fL   MCH 30.6 26.0 - 34.0 pg   MCHC 32.4 30.0 - 36.0 g/dL   RDW 13.2 11.5 - 15.5 %   Platelets 506 (H) 150 - 400 K/uL   Neutrophils Relative % 84 %   Neutro Abs 9.3 (H) 1.7 - 7.7 K/uL   Lymphocytes Relative 8 %   Lymphs Abs 0.9 0.7 - 4.0 K/uL   Monocytes Relative 7 %   Monocytes Absolute 0.8 0.1 - 1.0 K/uL   Eosinophils Relative 1 %   Eosinophils Absolute 0.1 0.0 - 0.7 K/uL   Basophils Relative 0 %   Basophils Absolute 0.0 0.0 - 0.1 K/uL  HIV antibody     Status: None   Collection Time: 08/12/15  3:37 AM  Result Value Ref Range   HIV Screen 4th Generation wRfx Non Reactive Non Reactive    Comment: (NOTE) Performed At: [facility] [place], [place] [id number] Lindon Romp MD [id number]   Sedimentation rate     Status: Abnormal   Collection Time: 08/12/15  3:37 AM  Result Value Ref Range   Sed Rate 112 (H) 0 - 16 mm/hr  C-reactive protein     Status: Abnormal     Collection Time: 08/12/15  3:37 AM  Result Value Ref Range   CRP 25.2 (H) <1.0 mg/dL  Comprehensive metabolic panel     Status: Abnormal   Collection Time: 08/12/15  3:37 AM  Result Value Ref Range   Sodium 135 135 - 145 mmol/L  Potassium 3.9 3.5 - 5.1 mmol/L   Chloride 96 (L) 101 - 111 mmol/L   CO2 30 22 - 32 mmol/L   Glucose, Bld 142 (H) 65 - 99 mg/dL   BUN 11 6 - 20 mg/dL   Creatinine, Ser 0.88 0.61 - 1.24 mg/dL   Calcium 8.2 (L) 8.9 - 10.3 mg/dL   Total Protein 6.3 (L) 6.5 - 8.1 g/dL   Albumin 1.7 (L) 3.5 - 5.0 g/dL   AST 50 (H) 15 - 41 U/L   ALT 54 17 - 63 U/L   Alkaline Phosphatase 183 (H) 38 - 126 U/L   Total Bilirubin 0.9 0.3 - 1.2 mg/dL   GFR calc non Af Amer >60 >60 mL/min   GFR calc Af Amer >60 >60 mL/min    Comment: (NOTE) The eGFR has been calculated using the CKD EPI equation. This calculation has not been validated in all clinical situations. eGFR's persistently <60 mL/min signify possible Chronic Kidney Disease.    Anion gap 9 5 - 15  Hepatitis c antibody (reflex)     Status: None   Collection Time: 08/12/15  3:37 AM  Result Value Ref Range   HCV Ab <0.1 0.0 - 0.9 s/co ratio    Comment: (NOTE) Performed At: [facility] [place], [place] [id number] Lindon Romp MD AF:[id number]   HCV Comment:     Status: None   Collection Time: 08/12/15  3:37 AM  Result Value Ref Range   Comment: Comment     Comment: (NOTE) Non reactive HCV antibody screen is consistent with no HCV infection, unless recent infection is suspected or other evidence exists to indicate HCV infection. Performed At: [facility] [place], [place] [id number] Lindon Romp MD YO:[id number]   CBC with Differential/Platelet     Status: Abnormal   Collection Time: 08/13/15  5:10 AM  Result Value Ref Range   WBC 9.9 4.0 - 10.5 K/uL   RBC 3.88 (L) 4.22 - 5.81 MIL/uL   Hemoglobin 12.2 (L) 13.0 - 17.0 g/dL   HCT 36.4 (L) 39.0 - 52.0 %    MCV 93.8 78.0 - 100.0 fL   MCH 31.4 26.0 - 34.0 pg   MCHC 33.5 30.0 - 36.0 g/dL   RDW 13.2 11.5 - 15.5 %   Platelets 518 (H) 150 - 400 K/uL   Neutrophils Relative % 84 %   Neutro Abs 8.3 (H) 1.7 - 7.7 K/uL   Lymphocytes Relative 8 %   Lymphs Abs 0.7 0.7 - 4.0 K/uL   Monocytes Relative 8 %   Monocytes Absolute 0.8 0.1 - 1.0 K/uL   Eosinophils Relative 1 %   Eosinophils Absolute 0.1 0.0 - 0.7 K/uL   Basophils Relative 0 %   Basophils Absolute 0.0 0.0 - 0.1 K/uL  Surgical pcr screen     Status: Abnormal   Collection Time: 08/13/15  5:27 AM  Result Value Ref Range   MRSA, PCR NEGATIVE NEGATIVE   Staphylococcus aureus POSITIVE (A) NEGATIVE    Comment:        The Xpert SA Assay (FDA approved for NASAL specimens in patients over 16 years of age), is one component of a comprehensive surveillance program.  Test performance has been validated by [facility] for patients greater than or equal to 32 year old. It is not intended to diagnose infection nor to guide or monitor treatment.   Culture, blood (routine x 2)     Status: None  Collection Time: 08/13/15  5:35 AM  Result Value Ref Range   Specimen Description BLOOD LEFT WRIST    Special Requests BOTTLES DRAWN AEROBIC AND ANAEROBIC 10CC    Culture NO GROWTH 5 DAYS    Report Status 08/18/2015 FINAL   Culture, blood (routine x 2)     Status: None   Collection Time: 08/13/15  5:40 AM  Result Value Ref Range   Specimen Description BLOOD LEFT HAND    Special Requests BOTTLES DRAWN AEROBIC AND ANAEROBIC 5CC    Culture NO GROWTH 5 DAYS    Report Status 08/18/2015 FINAL   CBC with Differential/Platelet     Status: Abnormal   Collection Time: 08/14/15  4:41 AM  Result Value Ref Range   WBC 11.3 (H) 4.0 - 10.5 K/uL   RBC 3.98 (L) 4.22 - 5.81 MIL/uL   Hemoglobin 12.0 (L) 13.0 - 17.0 g/dL   HCT 37.5 (L) 39.0 - 52.0 %   MCV 94.2 78.0 - 100.0 fL   MCH 30.2 26.0 - 34.0 pg   MCHC 32.0 30.0 - 36.0 g/dL   RDW 13.0 11.5 - 15.5 %    Platelets 396 150 - 400 K/uL   Neutrophils Relative % 84 %   Neutro Abs 9.5 (H) 1.7 - 7.7 K/uL   Lymphocytes Relative 8 %   Lymphs Abs 0.9 0.7 - 4.0 K/uL   Monocytes Relative 7 %   Monocytes Absolute 0.7 0.1 - 1.0 K/uL   Eosinophils Relative 1 %   Eosinophils Absolute 0.1 0.0 - 0.7 K/uL   Basophils Relative 0 %   Basophils Absolute 0.0 0.0 - 0.1 K/uL  CBC with Differential/Platelet     Status: Abnormal   Collection Time: 08/15/15  6:53 AM  Result Value Ref Range   WBC 10.1 4.0 - 10.5 K/uL   RBC 3.82 (L) 4.22 - 5.81 MIL/uL   Hemoglobin 11.6 (L) 13.0 - 17.0 g/dL   HCT 36.0 (L) 39.0 - 52.0 %   MCV 94.2 78.0 - 100.0 fL   MCH 30.4 26.0 - 34.0 pg   MCHC 32.2 30.0 - 36.0 g/dL   RDW 13.0 11.5 - 15.5 %   Platelets 341 150 - 400 K/uL   Neutrophils Relative % 82 %   Neutro Abs 8.3 (H) 1.7 - 7.7 K/uL   Lymphocytes Relative 8 %   Lymphs Abs 0.8 0.7 - 4.0 K/uL   Monocytes Relative 8 %   Monocytes Absolute 0.8 0.1 - 1.0 K/uL   Eosinophils Relative 2 %   Eosinophils Absolute 0.2 0.0 - 0.7 K/uL   Basophils Relative 0 %   Basophils Absolute 0.0 0.0 - 0.1 K/uL  CBC with Differential/Platelet     Status: Abnormal   Collection Time: 08/16/15  5:05 AM  Result Value Ref Range   WBC 9.7 4.0 - 10.5 K/uL   RBC 3.77 (L) 4.22 - 5.81 MIL/uL   Hemoglobin 11.8 (L) 13.0 - 17.0 g/dL   HCT 35.0 (L) 39.0 - 52.0 %   MCV 92.8 78.0 - 100.0 fL   MCH 31.3 26.0 - 34.0 pg   MCHC 33.7 30.0 - 36.0 g/dL   RDW 13.1 11.5 - 15.5 %   Platelets 459 (H) 150 - 400 K/uL   Neutrophils Relative % 84 %   Neutro Abs 8.2 (H) 1.7 - 7.7 K/uL   Lymphocytes Relative 8 %   Lymphs Abs 0.7 0.7 - 4.0 K/uL   Monocytes Relative 6 %   Monocytes Absolute 0.6 0.1 -  1.0 K/uL   Eosinophils Relative 2 %   Eosinophils Absolute 0.2 0.0 - 0.7 K/uL   Basophils Relative 0 %   Basophils Absolute 0.0 0.0 - 0.1 K/uL  Comprehensive metabolic panel     Status: Abnormal   Collection Time: 08/19/15  1:48 PM  Result Value Ref Range   Sodium 135  135 - 145 mmol/L   Potassium 4.1 3.5 - 5.1 mmol/L   Chloride 102 101 - 111 mmol/L   CO2 22 22 - 32 mmol/L   Glucose, Bld 197 (H) 65 - 99 mg/dL   BUN 15 6 - 20 mg/dL   Creatinine, Ser 0.74 0.61 - 1.24 mg/dL   Calcium 8.7 (L) 8.9 - 10.3 mg/dL   Total Protein 7.1 6.5 - 8.1 g/dL   Albumin 2.0 (L) 3.5 - 5.0 g/dL   AST 83 (H) 15 - 41 U/L   ALT 60 17 - 63 U/L   Alkaline Phosphatase 261 (H) 38 - 126 U/L   Total Bilirubin 0.9 0.3 - 1.2 mg/dL   GFR calc non Af Amer >60 >60 mL/min   GFR calc Af Amer >60 >60 mL/min    Comment: (NOTE) The eGFR has been calculated using the CKD EPI equation. This calculation has not been validated in all clinical situations. eGFR's persistently <60 mL/min signify possible Chronic Kidney Disease.    Anion gap 11 5 - 15  CBC with Differential     Status: Abnormal   Collection Time: 08/19/15  1:48 PM  Result Value Ref Range   WBC 10.9 (H) 4.0 - 10.5 K/uL   RBC 4.29 4.22 - 5.81 MIL/uL   Hemoglobin 13.0 13.0 - 17.0 g/dL   HCT 39.6 39.0 - 52.0 %   MCV 92.3 78.0 - 100.0 fL   MCH 30.3 26.0 - 34.0 pg   MCHC 32.8 30.0 - 36.0 g/dL   RDW 13.1 11.5 - 15.5 %   Platelets 410 (H) 150 - 400 K/uL   Neutrophils Relative % 84 %   Neutro Abs 9.2 (H) 1.7 - 7.7 K/uL   Lymphocytes Relative 8 %   Lymphs Abs 0.9 0.7 - 4.0 K/uL   Monocytes Relative 7 %   Monocytes Absolute 0.7 0.1 - 1.0 K/uL   Eosinophils Relative 1 %   Eosinophils Absolute 0.1 0.0 - 0.7 K/uL   Basophils Relative 0 %   Basophils Absolute 0.0 0.0 - 0.1 K/uL  I-Stat CG4 Lactic Acid, ED (Not at [facility])     Status: Abnormal   Collection Time: 08/19/15  1:54 PM  Result Value Ref Range   Lactic Acid, Venous 2.48 (HH) 0.5 - 2.0 mmol/L   Comment NOTIFIED PHYSICIAN   I-Stat CG4 Lactic Acid, ED (Not at [facility])     Status: None   Collection Time: 08/19/15  4:18 PM  Result Value Ref Range   Lactic Acid, Venous 1.06 0.5 - 2.0 mmol/L  I-Stat CG4 Lactic Acid, ED     Status: None   Collection Time: 08/19/15  8:33 PM    Result Value Ref Range   Lactic Acid, Venous 1.12 0.5 - 2.0 mmol/L  CBC     Status: Abnormal   Collection Time: 08/20/15  2:15 AM  Result Value Ref Range   WBC 8.9 4.0 - 10.5 K/uL   RBC 3.79 (L) 4.22 - 5.81 MIL/uL   Hemoglobin 11.6 (L) 13.0 - 17.0 g/dL   HCT 35.2 (L) 39.0 - 52.0 %   MCV 92.9 78.0 - 100.0 fL  MCH 30.6 26.0 - 34.0 pg   MCHC 33.0 30.0 - 36.0 g/dL   RDW 13.1 11.5 - 15.5 %   Platelets 378 150 - 400 K/uL  Comprehensive metabolic panel     Status: Abnormal   Collection Time: 08/20/15  2:15 AM  Result Value Ref Range   Sodium 136 135 - 145 mmol/L   Potassium 4.2 3.5 - 5.1 mmol/L   Chloride 100 (L) 101 - 111 mmol/L   CO2 28 22 - 32 mmol/L   Glucose, Bld 145 (H) 65 - 99 mg/dL   BUN 11 6 - 20 mg/dL   Creatinine, Ser 0.73 0.61 - 1.24 mg/dL   Calcium 8.3 (L) 8.9 - 10.3 mg/dL   Total Protein 6.3 (L) 6.5 - 8.1 g/dL   Albumin 1.7 (L) 3.5 - 5.0 g/dL   AST 85 (H) 15 - 41 U/L   ALT 66 (H) 17 - 63 U/L   Alkaline Phosphatase 263 (H) 38 - 126 U/L   Total Bilirubin 0.9 0.3 - 1.2 mg/dL   GFR calc non Af Amer >60 >60 mL/min   GFR calc Af Amer >60 >60 mL/min    Comment: (NOTE) The eGFR has been calculated using the CKD EPI equation. This calculation has not been validated in all clinical situations. eGFR's persistently <60 mL/min signify possible Chronic Kidney Disease.    Anion gap 8 5 - 15    1. LLE DVT Start anticoagulation eval by pharm to see if he can be transitioned to po  2. Septic arthritis Continue ancef (today is day 9/28) Has home PT His CRP and ESR were quite elevated, can f/u as outpt.   3, pre-diabetes Will check his A1C.   4. Increased LFTs Appears chronic (predates ancef?), although slightly worsened.  will f/u as oupt  5. Protein calorie malnutrition (severe) Will improve his protein intake Consider dietary eval.   Campbell Riches, MD 11/12/20169:54 AM

## 2015-08-19 NOTE — ED Notes (Signed)
PA at bedside.

## 2015-08-19 NOTE — ED Notes (Signed)
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 Rennis ChrisSupple to clean his lt. Knee and shoulder.   The [facility] Nurse was at pt.s home this morning and pt. Is having increased pain, swelling, fever,Home health Nurse   Spoke toDr. Rennis ChrisSupple they instructed him to come to ED for Doppler  Lt. Lower leg.

## 2015-08-19 NOTE — ED Provider Notes (Signed)
CSN: [id number]     Arrival date & time 08/19/15  1213 History   First MD Initiated Contact with Patient 08/19/15 1304     Chief Complaint  Patient presents with  . Knee Pain     (Consider location/radiation/quality/duration/timing/severity/associated sxs/prior Treatment) Patient is a 44 y.o. male presenting with knee pain. The history is provided by the patient and medical records. No language interpreter was used.  Knee Pain Associated symptoms: no back pain, no fever and no neck pain    BRETT DARKO is a 44 y.o. male  presents to the Emergency Department complaining of worsening left knee pain. He states constant pain since he underwent a knee washout on 11/03 after aspirate grew MSSA. In the past 2-3 days, he has noticed worsening pain in left knee and increased swelling. Pt. Admits to radiation of pain to left foot. Denies erythema. Current pain 6/10. No alleviating factors noted, aggravated by weight-bearing. Associated symptoms include muscle spasms, decrease appetite, and diaphoresis. Pt has been on Ancef through PICC.    Past Medical History  Diagnosis Date  . PONV (postoperative nausea and vomiting)     "just once"  . Medical history non-contributory    Past Surgical History  Procedure Laterality Date  . Rotator cuff repair Left   . Knee arthroscopy Right   . Shoulder arthroscopy Right   . Knee arthroscopy Left 08/11/2015    Procedure: Left Knee Arthroscopic Lavage;  Surgeon: Justice Britain, MD;  Location: [place];  Service: Orthopedics;  Laterality: Left;  . Fine needle aspiration Left 08/11/2015    Procedure: LEFT SHOULDER ASPIRATION;  Surgeon: Justice Britain, MD;  Location: [place];  Service: Orthopedics;  Laterality: Left;  . Shoulder arthroscopy Left 08/13/2015    Procedure: Arthroscopic Washout Of Left Shoulder;  Surgeon: Justice Britain, MD;  Location: [place];  Service: Orthopedics;  Laterality: Left;   No family history on file. Social History  Substance Use Topics  .  Smoking status: Never Smoker   . Smokeless tobacco: None  . Alcohol Use: No    Review of Systems  Constitutional: Positive for diaphoresis and appetite change (Decreased). Negative for fever and chills.  HENT: Negative for congestion, rhinorrhea and sore throat.   Eyes: Negative for visual disturbance.  Respiratory: Negative for cough, shortness of breath and wheezing.   Cardiovascular: Negative.   Gastrointestinal: Negative for nausea, vomiting, abdominal pain, diarrhea and constipation.  Endocrine: Negative for polydipsia and polyuria.  Musculoskeletal: Positive for myalgias and arthralgias. Negative for back pain and neck pain.  Skin: Negative for rash.  Neurological: Negative for dizziness, weakness and headaches.      Allergies  Review of patient's allergies indicates no known allergies.  Home Medications   Prior to Admission medications   Medication Sig Start Date End Date Taking? Authorizing Provider  ceFAZolin (ANCEF) 2-3 GM-% SOLR Inject 50 mLs (2 g total) into the vein every 8 (eight) hours. 08/15/15  Yes Tracy Shuford, PA-C  methocarbamol (ROBAXIN) 500 MG tablet Take 1 tablet (500 mg total) by mouth every 8 (eight) hours as needed for muscle spasms. 08/16/15  Yes Tracy Shuford, PA-C  oxyCODONE-acetaminophen (PERCOCET) 5-325 MG tablet Take 1-2 tablets by mouth every 4 (four) hours as needed. 08/15/15  Yes Tracy Shuford, PA-C  Riboflavin 400 MG TABS Take 1 tablet by mouth every 8 (eight) hours.   Yes Historical Provider, MD  zolpidem (AMBIEN) 10 MG tablet Take 1 tablet (10 mg total) by mouth at bedtime as needed for sleep.  08/16/15 09/15/15 Yes Tracy Shuford, PA-C  diazepam (VALIUM) 5 MG tablet Take 1 tablet (5 mg total) by mouth every 6 (six) hours as needed for anxiety, muscle spasms or sedation. Patient not taking: Reported on 08/19/2015 08/15/15   Olivia Mackie Shuford, PA-C   BP 142/77 mmHg  Pulse 114  Temp(Src) 98 F (36.7 C) (Oral)  Resp 16  SpO2 94% Physical Exam   Constitutional: He is oriented to person, place, and time. He appears well-developed and well-nourished.  Appears in pain but in no acute distress  HENT:  Head: Normocephalic and atraumatic.  Cardiovascular: Normal heart sounds and intact distal pulses.  Exam reveals no gallop and no friction rub.   No murmur heard. Tacky but regular  Pulmonary/Chest: Effort normal and breath sounds normal. No respiratory distress. He has no wheezes. He has no rales.  Abdominal: He exhibits no mass. There is no rebound and no guarding.  Abdomen soft, non-tender, non-distended Bowel sounds positive in all four quadrants   Musculoskeletal: He exhibits no edema.  Left knee with surgical lap scars + swelling, increased warmth to touch Decreased ROM likely 2/2 pain No erythema Generalized tenderness of left knee   Neurological: He is alert and oriented to person, place, and time.  Bilateral lower extremities neurovascularly intact.   Skin: Skin is warm and dry. No erythema.  Left knee with surgical lap scars which are clean, dry, intact with no drainage or erythema.  Psychiatric: He has a normal mood and affect. His behavior is normal. Judgment and thought content normal.  Nursing note and vitals reviewed.   ED Course  Procedures (including critical care time) Labs Review Labs Reviewed  COMPREHENSIVE METABOLIC PANEL - Abnormal; Notable for the following:    Glucose, Bld 197 (*)    Calcium 8.7 (*)    Albumin 2.0 (*)    AST 83 (*)    Alkaline Phosphatase 261 (*)    All other components within normal limits  CBC WITH DIFFERENTIAL/PLATELET - Abnormal; Notable for the following:    WBC 10.9 (*)    Platelets 410 (*)    Neutro Abs 9.2 (*)    All other components within normal limits  I-STAT CG4 LACTIC ACID, ED - Abnormal; Notable for the following:    Lactic Acid, Venous 2.48 (*)    All other components within normal limits  CULTURE, BLOOD (ROUTINE X 2)  CULTURE, BLOOD (ROUTINE X 2)  I-STAT CG4  LACTIC ACID, ED  I-STAT CG4 LACTIC ACID, ED  I-STAT CG4 LACTIC ACID, ED    Imaging Review Ct Angio Chest Pe W/cm &/or Wo Cm  08/19/2015  CLINICAL DATA:  Status post left knee and left shoulder surgery, with worsening pain, swelling and fever at the left leg. Diagnosed with left leg DVT. Assess for pulmonary embolus. Initial encounter. EXAM: CT ANGIOGRAPHY CHEST WITH CONTRAST TECHNIQUE: Multidetector CT imaging of the chest was performed using the standard protocol during bolus administration of intravenous contrast. Multiplanar CT image reconstructions and MIPs were obtained to evaluate the vascular anatomy. CONTRAST:  112m OMNIPAQUE IOHEXOL 350 MG/ML SOLN COMPARISON:  Chest radiograph performed 08/12/2015 FINDINGS: There is no evidence of central pulmonary embolus. However, there are a number of potential vague filling defects in the periphery of the pulmonary arteries bilaterally. This may simply reflect motion artifact or beam hardening artifact, though segmental pulmonary embolus cannot be excluded on the basis of this study. Minimal left-sided atelectasis is noted. The lungs are otherwise clear. There is no evidence of significant  focal consolidation, pleural effusion or pneumothorax. No masses are identified; no abnormal focal contrast enhancement is seen. The mediastinum is unremarkable in appearance. No mediastinal lymphadenopathy is seen. No pericardial effusion is identified. The great vessels are grossly unremarkable in appearance. No axillary lymphadenopathy is seen. The visualized portions of the thyroid gland are unremarkable in appearance. The visualized portions of the liver and spleen are unremarkable. No acute osseous abnormalities are seen. Review of the MIP images confirms the above findings. IMPRESSION: 1. No evidence of central pulmonary embolus. However, there are number of potential vague filling defects in the periphery of the pulmonary arteries bilaterally; these may simply be  artifactual, though segmental pulmonary embolus cannot be excluded on the basis of this study. Depending on the patient's symptoms, a repeat CTA of the chest could be considered. Note that a new peripheral IV catheter would be required; the patient's existing power PICC limited the rate of injection of contrast on this study, corresponding to the suboptimal results. 2. Minimal left-sided atelectasis noted.  Lungs otherwise clear. Electronically Signed   By: Garald Balding M.D.   On: 08/19/2015 18:33   I have personally reviewed and evaluated these images and lab results as part of my medical decision-making.   EKG Interpretation None      MDM   Final diagnoses:  Acute deep vein thrombosis (DVT) of proximal vein of left lower extremity (HCC)   Namon Cirri presents with worsening knee pain after arthroscopic- washout r/o septic joint.   Labs: Lactic acid 2.48; white count 10.9 with abs neutrophil 9.2; CMP- glucose 197, albumin 2, AST 83, alk phos 261, Ca 8.7  I spoke with Dr. Onnie Graham, his orthopedist, who states from their stand point, this is not an orthopedic issue. He is on appropriate ABX and has end stage arthritis of the knee which will be painful. No streaking or erythema. His main concern was the swelling and he was sent to r/o DVT.    4:35 PM - Repeat lactic wdl; U/S + for DVT involving the posterior tibial and peroneal veins; due to + result and tachycardia throughout time in ED, will CT chest to r/o PE. Discussed results and plan with patient and family who verbally understand and agree.   CT was indeterminate, saying that pulmonary embolus cannot be excluded on the basis of this study.  Discussed patient with internal medicine who will admit.     [facility] Orra Nolde, PA-C 08/19/15 2018  Harvel Quale, MD 08/21/15 [phone]

## 2015-08-19 NOTE — Progress Notes (Signed)
Preliminary results by tech - Left Lower Ext. Venous Duplex Completed. Positive for deep vein thrombosis involving the posterior tibial and peroneal veins. Results given to [facility] CenterMelissa. Marilynne Halstedita Wayman Hoard, BS, RDMS, RVT

## 2015-08-20 DIAGNOSIS — I824Y2 Acute embolism and thrombosis of unspecified deep veins of left proximal lower extremity: Secondary | ICD-10-CM

## 2015-08-20 DIAGNOSIS — R945 Abnormal results of liver function studies: Secondary | ICD-10-CM

## 2015-08-20 DIAGNOSIS — E8809 Other disorders of plasma-protein metabolism, not elsewhere classified: Secondary | ICD-10-CM

## 2015-08-20 DIAGNOSIS — E43 Unspecified severe protein-calorie malnutrition: Secondary | ICD-10-CM | POA: Insufficient documentation

## 2015-08-20 DIAGNOSIS — R7989 Other specified abnormal findings of blood chemistry: Secondary | ICD-10-CM

## 2015-08-20 LAB — COMPREHENSIVE METABOLIC PANEL
ALBUMIN: 1.7 g/dL — AB (ref 3.5–5.0)
ALT: 66 U/L — ABNORMAL HIGH (ref 17–63)
AST: 85 U/L — AB (ref 15–41)
Alkaline Phosphatase: 263 U/L — ABNORMAL HIGH (ref 38–126)
Anion gap: 8 (ref 5–15)
BILIRUBIN TOTAL: 0.9 mg/dL (ref 0.3–1.2)
BUN: 11 mg/dL (ref 6–20)
CHLORIDE: 100 mmol/L — AB (ref 101–111)
CO2: 28 mmol/L (ref 22–32)
Calcium: 8.3 mg/dL — ABNORMAL LOW (ref 8.9–10.3)
Creatinine, Ser: 0.73 mg/dL (ref 0.61–1.24)
GFR calc Af Amer: >60 mL/min (ref >60)
GFR calc non Af Amer: >60 mL/min (ref >60)
GLUCOSE: 145 mg/dL — AB (ref 65–99)
POTASSIUM: 4.2 mmol/L (ref 3.5–5.1)
SODIUM: 136 mmol/L (ref 135–145)
TOTAL PROTEIN: 6.3 g/dL — AB (ref 6.5–8.1)

## 2015-08-20 LAB — CBC
HEMATOCRIT: 35.2 % — AB (ref 39.0–52.0)
HEMOGLOBIN: 11.6 g/dL — AB (ref 13.0–17.0)
MCH: 30.6 pg (ref 26.0–34.0)
MCHC: 33 g/dL (ref 30.0–36.0)
MCV: 92.9 fL (ref 78.0–100.0)
Platelets: 378 10*3/uL (ref 150–400)
RBC: 3.79 MIL/uL — AB (ref 4.22–5.81)
RDW: 13.1 % (ref 11.5–15.5)
WBC: 8.9 10*3/uL (ref 4.0–10.5)

## 2015-08-20 LAB — GAMMA GT: GGT: 216 U/L — ABNORMAL HIGH (ref 7–50)

## 2015-08-20 MED ORDER — POLYETHYLENE GLYCOL 3350 17 G PO PACK: 17.0000 g | PACK | Freq: Every day | ORAL | Status: DC

## 2015-08-20 MED ORDER — CEFAZOLIN SODIUM-DEXTROSE 2-3 GM-% IV SOLR: 2.0000 g | Freq: Three times a day (TID) | INTRAVENOUS | Status: DC

## 2015-08-20 MED ORDER — RIVAROXABAN 15 MG PO TABS: 15.0000 mg | ORAL_TABLET | Freq: Two times a day (BID) | ORAL | Status: DC

## 2015-08-20 MED ORDER — RIVAROXABAN 15 MG PO TABS
15.0000 mg | ORAL_TABLET | Freq: Two times a day (BID) | ORAL | Status: DC
  Administered 2015-08-20: 15 mg via ORAL
  Filled 2015-08-20: qty 1

## 2015-08-20 MED ORDER — RIVAROXABAN 20 MG PO TABS: 20.0000 mg | ORAL_TABLET | Freq: Every day | ORAL | Status: DC

## 2015-08-20 MED ORDER — DOCUSATE SODIUM 100 MG PO CAPS
100.0000 mg | ORAL_CAPSULE | Freq: Two times a day (BID) | ORAL | Status: DC
  Administered 2015-08-20: 100 mg via ORAL
  Filled 2015-08-20: qty 1

## 2015-08-20 MED ORDER — HEPARIN SOD (PORK) LOCK FLUSH 100 UNIT/ML IV SOLN
250.0000 [IU] | INTRAVENOUS | Status: AC | PRN
  Administered 2015-08-20: 250 [IU]

## 2015-08-20 MED ORDER — RIVAROXABAN 20 MG PO TABS
20.0000 mg | ORAL_TABLET | Freq: Every day | ORAL | Status: DC
Start: 2015-09-10 — End: 2015-08-20

## 2015-08-20 MED ORDER — DOCUSATE SODIUM 100 MG PO CAPS: 100.0000 mg | ORAL_CAPSULE | Freq: Two times a day (BID) | ORAL | Status: DC

## 2015-08-20 NOTE — Progress Notes (Signed)
Subjective: Patient is doing well. Does have pain on the left leg from his DVT with redness and tenderness and swelling. Denies any chest pain or SOB, fever, chills, dizziness, or any other complaints.   Objective: Vital signs in last 24 hours: Filed Vitals:   08/19/15 2130 08/19/15 2145 08/19/15 2200 08/19/15 2308  BP: 135/82 134/79 135/79 132/68  Pulse: 110 108 109 120  Temp:    100.2 F (37.9 C)  TempSrc:    Oral  Resp:   16 22  SpO2: 94% 96% 93% 96%   Weight change:   Intake/Output Summary (Last 24 hours) at 08/20/15 1041 Last data filed at 08/19/15 1901  Gross per 24 hour  Intake   1050 ml  Output      0 ml  Net   1050 ml   Vitals reviewed. General: resting in bed, NAD HEENT: PERRL, EOMI, no scleral icterus Cardiac: RRR, no rubs, murmurs or gallops Pulm: clear to auscultation bilaterally, no wheezes, rales, or rhonchi Abd: soft, nontender, nondistended, BS present Ext: left calf swollen, red, and tender. Has good pulses bilaterally. Right arm PICC looks normal without signs of infection.  Neuro: alert and oriented X3, cranial nerves II-XII grossly intact, strength and sensation to light touch equal in bilateral upper and lower extremities  Lab Results: Basic Metabolic Panel:  Recent Labs Lab 08/19/15 1348 08/20/15 0215  NA 135 136  K 4.1 4.2  CL 102 100*  CO2 22 28  GLUCOSE 197* 145*  BUN 15 11  CREATININE 0.74 0.73  CALCIUM 8.7* 8.3*   Liver Function Tests:  Recent Labs Lab 08/19/15 1348 08/20/15 0215  AST 83* 85*  ALT 60 66*  ALKPHOS 261* 263*  BILITOT 0.9 0.9  PROT 7.1 6.3*  ALBUMIN 2.0* 1.7*  CBC:  Recent Labs Lab 08/16/15 0505 08/19/15 1348 08/20/15 0215  WBC 9.7 10.9* 8.9  NEUTROABS 8.2* 9.2*  --   HGB 11.8* 13.0 11.6*  HCT 35.0* 39.6 35.2*  MCV 92.8 92.3 92.9  PLT 459* 410* 378  Misc. Labs:  Micro Results: Recent Results (from the past 240 hour(s))  Body fluid culture     Status: None   Collection Time: 08/11/15  4:24 PM    Result Value Ref Range Status   Specimen Description SYNOVIAL LEFT SHOULDER  Final   Special Requests SPECIMEN A PT ON ANCEF  Final   Gram Stain   Final    ABUNDANT WBC PRESENT, PREDOMINANTLY PMN NO ORGANISMS SEEN    Culture   Final    STAPHYLOCOCCUS AUREUS CRITICAL RESULT CALLED TO, READ BACK BY AND VERIFIED WITH: P QUSSOUR 08/12/15 @ 1424 M VESTAL    Report Status 08/14/2015 FINAL  Final   Organism ID, Bacteria STAPHYLOCOCCUS AUREUS  Final      Susceptibility   Staphylococcus aureus - MIC*    CIPROFLOXACIN <=0.5 SENSITIVE Sensitive     ERYTHROMYCIN 0.5 SENSITIVE Sensitive     GENTAMICIN <=0.5 SENSITIVE Sensitive     OXACILLIN 0.5 SENSITIVE Sensitive     TETRACYCLINE <=1 SENSITIVE Sensitive     VANCOMYCIN <=0.5 SENSITIVE Sensitive     TRIMETH/SULFA <=10 SENSITIVE Sensitive     CLINDAMYCIN <=0.25 SENSITIVE Sensitive     RIFAMPIN <=0.5 SENSITIVE Sensitive     Inducible Clindamycin NEGATIVE Sensitive     * STAPHYLOCOCCUS AUREUS  Surgical pcr screen     Status: Abnormal   Collection Time: 08/13/15  5:27 AM  Result Value Ref Range Status   MRSA, PCR  NEGATIVE NEGATIVE Final   Staphylococcus aureus POSITIVE (A) NEGATIVE Final    Comment:        The Xpert SA Assay (FDA approved for NASAL specimens in patients over 56 years of age), is one component of a comprehensive surveillance program.  Test performance has been validated by [facility] for patients greater than or equal to 22 year old. It is not intended to diagnose infection nor to guide or monitor treatment.   Culture, blood (routine x 2)     Status: None   Collection Time: 08/13/15  5:35 AM  Result Value Ref Range Status   Specimen Description BLOOD LEFT WRIST  Final   Special Requests BOTTLES DRAWN AEROBIC AND ANAEROBIC 10CC  Final   Culture NO GROWTH 5 DAYS  Final   Report Status 08/18/2015 FINAL  Final  Culture, blood (routine x 2)     Status: None   Collection Time: 08/13/15  5:40 AM  Result Value Ref Range  Status   Specimen Description BLOOD LEFT HAND  Final   Special Requests BOTTLES DRAWN AEROBIC AND ANAEROBIC 5CC  Final   Culture NO GROWTH 5 DAYS  Final   Report Status 08/18/2015 FINAL  Final  Culture, blood (routine x 2)     Status: None (Preliminary result)   Collection Time: 08/19/15  1:48 PM  Result Value Ref Range Status   Specimen Description BLOOD LEFT HAND  Final   Special Requests BOTTLES DRAWN AEROBIC AND ANAEROBIC 5CC  Final   Culture NO GROWTH < 24 HOURS  Final   Report Status PENDING  Incomplete  Culture, blood (routine x 2)     Status: None (Preliminary result)   Collection Time: 08/19/15  3:08 PM  Result Value Ref Range Status   Specimen Description BLOOD LEFT HAND  Final   Special Requests IN PEDIATRIC BOTTLE 1CC  Final   Culture NO GROWTH < 24 HOURS  Final   Report Status PENDING  Incomplete   Studies/Results: Ct Angio Chest Pe W/cm &/or Wo Cm  08/19/2015  CLINICAL DATA:  Status post left knee and left shoulder surgery, with worsening pain, swelling and fever at the left leg. Diagnosed with left leg DVT. Assess for pulmonary embolus. Initial encounter. EXAM: CT ANGIOGRAPHY CHEST WITH CONTRAST TECHNIQUE: Multidetector CT imaging of the chest was performed using the standard protocol during bolus administration of intravenous contrast. Multiplanar CT image reconstructions and MIPs were obtained to evaluate the vascular anatomy. CONTRAST:  161m OMNIPAQUE IOHEXOL 350 MG/ML SOLN COMPARISON:  Chest radiograph performed 08/12/2015 FINDINGS: There is no evidence of central pulmonary embolus. However, there are a number of potential vague filling defects in the periphery of the pulmonary arteries bilaterally. This may simply reflect motion artifact or beam hardening artifact, though segmental pulmonary embolus cannot be excluded on the basis of this study. Minimal left-sided atelectasis is noted. The lungs are otherwise clear. There is no evidence of significant focal consolidation,  pleural effusion or pneumothorax. No masses are identified; no abnormal focal contrast enhancement is seen. The mediastinum is unremarkable in appearance. No mediastinal lymphadenopathy is seen. No pericardial effusion is identified. The great vessels are grossly unremarkable in appearance. No axillary lymphadenopathy is seen. The visualized portions of the thyroid gland are unremarkable in appearance. The visualized portions of the liver and spleen are unremarkable. No acute osseous abnormalities are seen. Review of the MIP images confirms the above findings. IMPRESSION: 1. No evidence of central pulmonary embolus. However, there are number of potential vague  filling defects in the periphery of the pulmonary arteries bilaterally; these may simply be artifactual, though segmental pulmonary embolus cannot be excluded on the basis of this study. Depending on the patient's symptoms, a repeat CTA of the chest could be considered. Note that a new peripheral IV catheter would be required; the patient's existing power PICC limited the rate of injection of contrast on this study, corresponding to the suboptimal results. 2. Minimal left-sided atelectasis noted.  Lungs otherwise clear. Electronically Signed   By: Garald Balding M.D.   On: 08/19/2015 18:33   Medications: I have reviewed the patient's current medications. Scheduled Meds: . B-complex with vitamin C  1 tablet Oral Daily  . ceFAZolin  2 g Intravenous 3 times per day  . docusate sodium  100 mg Oral BID  . polyethylene glycol  17 g Oral Daily  . Rivaroxaban  15 mg Oral BID WC  . [START ON 09/10/2015] Rivaroxaban  20 mg Oral Q supper  . sodium chloride  10-40 mL Intracatheter Q12H  . sodium chloride  3 mL Intravenous Q12H   Continuous Infusions:  PRN Meds:.methocarbamol, oxyCODONE-acetaminophen, sodium chloride, zolpidem Assessment/Plan: Principal Problem:   Venous thromboembolism (VTE) Active Problems:   Abnormal LFTs   Hypoalbuminemia   Acute  deep vein thrombosis (DVT) of proximal vein of left lower extremity (HCC)   Severe protein-calorie malnutrition (HCC)  Provoked DVT of Left Lower ext without evidence of PE on CTA  had recent surgery on 11/3 (left knee lavage) and 11/5 (left shoulder lavage) for septic joints growing MSSA. DVT study showing left sided posterior tibial and peroneal vein DVT. Does have significant swelling, erythema, and tenderness on left calf. CTA  PE study was nonconclusive for PE. However, he does not have any hypoxia, chest pain or pressure. His tachycardia could be the result of pain. It's less likely that he has a PE. - will treat him for provoked DVT for 3 months. He did receive lovenox in the hospital. Will switch to xarelto on discharge, which he should continue until about 11/19/2014.    MSSA septic joints - left knee and left shoulder   s/p lavage on 11/3 (left knee) and 11/5 (left shoulder). Has been on Ancef via PICC line since last discharge.  - finish Ancef course for 4 weeks total to be completed on 07/08/15 - cont home PT  Elevated LFT - with no significant abdominal pain  AST and ALT very mildly elevated, ALK phos 263. This could be 2/2 to recent infecton - ordered GGT, and checking Hep C ab. This can be followed up outpatient.   Severe Protein calorie malnutrition - improve protein intake  Dispo: Disposition is deferred at this time, awaiting improvement of current medical problems.  Anticipated discharge in approximately 1-2 day(s).   The patient does have a current PCP (Pcp Not In System) and does need an [facility] hospital follow-up appointment after discharge.  The patient does have transportation limitations that hinder transportation to clinic appointments.  .Services Needed at time of discharge: Y = Yes, Blank = No PT:   OT:   RN:   Equipment:   Other:       Dellia Nims, MD 08/20/2015, 10:41 AM

## 2015-08-20 NOTE — Progress Notes (Signed)
After review of chart and discussion with ID Pt removed from contact isolation at this time.

## 2015-08-20 NOTE — Discharge Instructions (Addendum)
Finish up your antibiotic course through PICC line on 09/07/15.  Start taking Xarelto for your blood clot, you will need to be on this for at least 3 months.  Follow up with your primary care doctor and with the orthopaedic doctor.     Rivaroxaban oral tablets What is this medicine? RIVAROXABAN (ri va ROX a ban) is an anticoagulant (blood thinner). It is used to treat blood clots in the lungs or in the veins. It is also used after knee or hip surgeries to prevent blood clots. It is also used to lower the chance of stroke in people with a medical condition called atrial fibrillation. This medicine may be used for other purposes; ask your health care provider or pharmacist if you have questions. What should I tell my health care provider before I take this medicine? They need to know if you have any of these conditions: -bleeding disorders -bleeding in the brain -blood in your stools (black or tarry stools) or if you have blood in your vomit -history of stomach bleeding -kidney disease -liver disease -low blood counts, like low white cell, platelet, or red cell counts -recent or planned spinal or epidural procedure -take medicines that treat or prevent blood clots -an unusual or allergic reaction to rivaroxaban, other medicines, foods, dyes, or preservatives -pregnant or trying to get pregnant -breast-feeding How should I use this medicine? Take this medicine by mouth with a glass of water. Follow the directions on the prescription label. Take your medicine at regular intervals. Do not take it more often than directed. Do not stop taking except on your doctor's advice. Stopping this medicine may increase your risk of a blood clot. Be sure to refill your prescription before you run out of medicine. If you are taking this medicine after hip or knee replacement surgery, take it with or without food. If you are taking this medicine for atrial fibrillation, take it with your evening meal. If you  are taking this medicine to treat blood clots, take it with food at the same time each day. If you are unable to swallow your tablet, you may crush the tablet and mix it in applesauce. Then, immediately eat the applesauce. You should eat more food right after you eat the applesauce containing the crushed tablet. Talk to your pediatrician regarding the use of this medicine in children. Special care may be needed. Overdosage: If you think you have taken too much of this medicine contact a poison control center or emergency room at once. NOTE: This medicine is only for you. Do not share this medicine with others. What if I miss a dose? If you take your medicine once a day and miss a dose, take the missed dose as soon as you remember. If you take your medicine twice a day and miss a dose, take the missed dose immediately. In this instance, 2 tablets may be taken at the same time. The next day you should take 1 tablet twice a day as directed. What may interact with this medicine? -aspirin and aspirin-like medicines -certain antibiotics like erythromycin, azithromycin, and clarithromycin -certain medicines for fungal infections like ketoconazole and itraconazole -certain medicines for irregular heart beat like amiodarone, quinidine, dronedarone -certain medicines for seizures like carbamazepine, phenytoin -certain medicines that treat or prevent blood clots like warfarin, enoxaparin, and dalteparin -conivaptan -diltiazem -felodipine -indinavir -lopinavir; ritonavir -NSAIDS, medicines for pain and inflammation, like ibuprofen or naproxen -ranolazine -rifampin -ritonavir -St. John's wort -verapamil This list may not describe all possible  interactions. Give your health care provider a list of all the medicines, herbs, non-prescription drugs, or dietary supplements you use. Also tell them if you smoke, drink alcohol, or use illegal drugs. Some items may interact with your medicine. What should I watch  for while using this medicine? Visit your doctor or health care professional for regular checks on your progress. Your condition will be monitored carefully while you are receiving this medicine. Notify your doctor or health care professional and seek emergency treatment if you develop breathing problems; changes in vision; chest pain; severe, sudden headache; pain, swelling, warmth in the leg; trouble speaking; sudden numbness or weakness of the face, arm, or leg. These can be signs that your condition has gotten worse. If you are going to have surgery, tell your doctor or health care professional that you are taking this medicine. Tell your health care professional that you use this medicine before you have a spinal or epidural procedure. Sometimes people who take this medicine have bleeding problems around the spine when they have a spinal or epidural procedure. This bleeding is very rare. If you have a spinal or epidural procedure while on this medicine, call your health care professional immediately if you have back pain, numbness or tingling (especially in your legs and feet), muscle weakness, paralysis, or loss of bladder or bowel control. Avoid sports and activities that might cause injury while you are using this medicine. Severe falls or injuries can cause unseen bleeding. Be careful when using sharp tools or knives. Consider using an Neurosurgeonelectric razor. Take special care brushing or flossing your teeth. Report any injuries, bruising, or red spots on the skin to your doctor or health care professional. What side effects may I notice from receiving this medicine? Side effects that you should report to your doctor or health care professional as soon as possible: -allergic reactions like skin rash, itching or hives, swelling of the face, lips, or tongue -back pain -redness, blistering, peeling or loosening of the skin, including inside the mouth -signs and symptoms of bleeding such as bloody or black,  tarry stools; red or dark-brown urine; spitting up blood or brown material that looks like coffee grounds; red spots on the skin; unusual bruising or bleeding from the eye, gums, or nose Side effects that usually do not require medical attention (Report these to your doctor or health care professional if they continue or are bothersome.): -dizziness -muscle pain This list may not describe all possible side effects. Call your doctor for medical advice about side effects. You may report side effects to FDA at 1-800-FDA-1088. Where should I keep my medicine? Keep out of the reach of children. Store at room temperature between 15 and 30 degrees C (59 and 86 degrees F). Throw away any unused medicine after the expiration date. NOTE: This sheet is a summary. It may not cover all possible information. If you have questions about this medicine, talk to your doctor, pharmacist, or health care provider.    2016, Elsevier/Gold Standard. (2014-09-22 12:45:34)

## 2015-08-20 NOTE — Progress Notes (Signed)
ANTICOAGULATION CONSULT NOTE - Follow Up Consult  Pharmacy Consult for Lovenox --> Xarelto Indication: LLE DVT  No Known Allergies  Patient Measurements: 116 kg 5\' 11"   Vital Signs: Temp: 100.2 F (37.9 C) (11/11 2308) Temp Source: Oral (11/11 2308) BP: 132/68 mmHg (11/11 2308) Pulse Rate: 120 (11/11 2308)  Labs:  Recent Labs  08/19/15 1348 08/20/15 0215  HGB 13.0 11.6*  HCT 39.6 35.2*  PLT 410* 378  CREATININE 0.74 0.73    Estimated Creatinine Clearance: 152.5 mL/min (by C-G formula based on Cr of 0.73).  Assessment: 44 y/o male s/p L rotator cuff repair and recent admit for two septic joints with provoked acute LLE DVT. He was started on IV heparin which was switched to Lovenox. Pharmacy consulted to switch Lovenox to Xarelto. No bleeding noted, Hb stable, platelets are normal. Renal function is normal. Last Lovenox dose was 11/12 at 00:17.    Goal of Therapy:  full dose anticoagulation Monitor platelets by anticoagulation protocol: Yes   Plan:  - Begin Xarelto 15 mg PO bid with meals for 21 days - first dose now - After 21 days, begin Xarelto 20 mg PO qdws - Monitor for s/sx of bleeding - Education to be provided  Fluor CorporationJennifer Gilbert, MarengoPharm.D., BCPS Clinical Pharmacist Pager: [phone] 08/20/2015 10:29 AM

## 2015-08-20 NOTE — Progress Notes (Signed)
Noted right single lumen PICC site red and small amount of purulent drainage.Patient denies tenderness around site. Notified primary RN Nego. She will notify MD for further orders. Chong SicilianLioba Atreus Hasz, IV team RN

## 2015-08-20 NOTE — Discharge Summary (Signed)
Name: Raymond Peterson MRN: [id number] DOB: [birth year] 44 y.o. PCP: Pcp Not In System  Date of Admission: 08/19/2015  1:03 PM Date of Discharge: 08/20/2015 Attending Physician: Campbell Riches, MD  Discharge Diagnosis: Principal Problem:   Venous thromboembolism (VTE) Active Problems:   Abnormal LFTs   Hypoalbuminemia   Acute deep vein thrombosis (DVT) of proximal vein of left lower extremity (HCC)   Severe protein-calorie malnutrition ([place])  Discharge Medications:   Medication List    TAKE these medications        ceFAZolin 2-3 GM-% Solr  Commonly known as:  ANCEF  Inject 50 mLs (2 g total) into the vein every 8 (eight) hours.     diazepam 5 MG tablet  Commonly known as:  VALIUM  Take 1 tablet (5 mg total) by mouth every 6 (six) hours as needed for anxiety, muscle spasms or sedation.     docusate sodium 100 MG capsule  Commonly known as:  COLACE  Take 1 capsule (100 mg total) by mouth 2 (two) times daily.     methocarbamol 500 MG tablet  Commonly known as:  ROBAXIN  Take 1 tablet (500 mg total) by mouth every 8 (eight) hours as needed for muscle spasms.     oxyCODONE-acetaminophen 5-325 MG tablet  Commonly known as:  PERCOCET  Take 1-2 tablets by mouth every 4 (four) hours as needed.     polyethylene glycol packet  Commonly known as:  MIRALAX / GLYCOLAX  Take 17 g by mouth daily.     Riboflavin 400 MG Tabs  Take 1 tablet by mouth every 8 (eight) hours.     Rivaroxaban 15 MG Tabs tablet  Commonly known as:  XARELTO  Take 1 tablet (15 mg total) by mouth 2 (two) times daily with a meal.     rivaroxaban 20 MG Tabs tablet  Commonly known as:  XARELTO  Take 1 tablet (20 mg total) by mouth daily with supper.  Start taking on:  09/10/2015     zolpidem 10 MG tablet  Commonly known as:  AMBIEN  Take 1 tablet (10 mg total) by mouth at bedtime as needed for sleep.        Disposition and follow-up:   Mr.Raymond Peterson was discharged from [facility] in Stable condition.  At the hospital follow up visit please address:  1.  Please make sure he is taking his Xarelto for DVT for 3 months.  Had elevated Alk phos and mild AST/ALT elevation. Checking GGT and Hep C in the hospital. Please follow up these results.  Will have to finish Ancef for septic joints, last dose 09/07/15.  2.  Labs / imaging needed at time of follow-up: CMET  3.  Pending labs/ test needing follow-up: GGT, Hep C.  Follow-up Appointments:   Discharge Instructions:   Consultations:    Procedures Performed:  Ct Angio Chest Pe W/cm &/or Wo Cm  08/19/2015  CLINICAL DATA:  Status post left knee and left shoulder surgery, with worsening pain, swelling and fever at the left leg. Diagnosed with left leg DVT. Assess for pulmonary embolus. Initial encounter. EXAM: CT ANGIOGRAPHY CHEST WITH CONTRAST TECHNIQUE: Multidetector CT imaging of the chest was performed using the standard protocol during bolus administration of intravenous contrast. Multiplanar CT image reconstructions and MIPs were obtained to evaluate the vascular anatomy. CONTRAST:  166m OMNIPAQUE IOHEXOL 350 MG/ML SOLN COMPARISON:  Chest radiograph performed 08/12/2015 FINDINGS: There is no evidence of central pulmonary embolus.  However, there are a number of potential vague filling defects in the periphery of the pulmonary arteries bilaterally. This may simply reflect motion artifact or beam hardening artifact, though segmental pulmonary embolus cannot be excluded on the basis of this study. Minimal left-sided atelectasis is noted. The lungs are otherwise clear. There is no evidence of significant focal consolidation, pleural effusion or pneumothorax. No masses are identified; no abnormal focal contrast enhancement is seen. The mediastinum is unremarkable in appearance. No mediastinal lymphadenopathy is seen. No pericardial effusion is identified. The great vessels are grossly unremarkable in appearance. No  axillary lymphadenopathy is seen. The visualized portions of the thyroid gland are unremarkable in appearance. The visualized portions of the liver and spleen are unremarkable. No acute osseous abnormalities are seen. Review of the MIP images confirms the above findings. IMPRESSION: 1. No evidence of central pulmonary embolus. However, there are number of potential vague filling defects in the periphery of the pulmonary arteries bilaterally; these may simply be artifactual, though segmental pulmonary embolus cannot be excluded on the basis of this study. Depending on the patient's symptoms, a repeat CTA of the chest could be considered. Note that a new peripheral IV catheter would be required; the patient's existing power PICC limited the rate of injection of contrast on this study, corresponding to the suboptimal results. 2. Minimal left-sided atelectasis noted.  Lungs otherwise clear. Electronically Signed   By: Garald Balding M.D.   On: 08/19/2015 18:33   Dg Chest Port 1 View  08/12/2015  CLINICAL DATA:  Right PICC line insertion, left knee infection, access for long-term antibiotics EXAM: PORTABLE CHEST 1 VIEW COMPARISON:  None. FINDINGS: Right PICC line mid SVC level. Mild cardiac enlargement and prominent hilar vascularity. Minimal basilar atelectasis. No focal pneumonia, collapse or consolidation. No edema, effusion or pneumothorax. Trachea midline. No acute osseous finding. Monitor leads overlie the chest. IMPRESSION: Right PICC line tip mid SVC level. Cardiomegaly without CHF Basilar atelectasis Electronically Signed   By: Jerilynn Mages.  Shick M.D.   On: 08/12/2015 08:56    Admission HPI:   Mr. Raymond Peterson is a 44 year old man with with a history of left rotator cuff repair (Followed by Dr. Onnie Graham from Orthopedics), left glenohumeral arthritic changes and recent hospitalization for two septic joints who presents with worsening left leg pain and swelling. The patient had two arthroscopic lavages on 11/3 and 11/5 for  septic joints in the left glenohumeral joint and left knee growing MSSA. He was discharged with a PICC in place to complete a 4 week course of IV cefazolin. On 11/8, when he was discharged, Mr. Gowdy noticed that his sock did not quite fit on the left, and he had persistent left knee pain. This continued until 11/11 as his left leg continued to swell and the pain intensified. He is still unable to walk on it. He describes it as starting at the back of his left knee and radiating to his foot. He has been taking 2 tablets oxycodone 5-325 mg q4h and robaxin which has become less effective. He also reports night sweats, which he reports "have been going on for months." Otherwise, he denies any chills, headache, shortness of breath, chest pain, heart palpitations, cough, nausea, vomiting, diarrhea, constipation, pain with urination, or new one-sided weakness. He does not have any family history of clotting disorders. He is a non-smoker, does not use steroids, and does not use illicit drugs.   In the ED, he was afebrile with a WBC of 10.9 (NAb 9.2).  He had a lactic acid of 2.48 that has since normalized. Dr. Onnie Graham, his orthopedist was informed and did not recommend any intervention from an orthopedic standpoint. Lower extremity US showing DVT involving the posterior tibial and peroneal veins. Given tachycardia (103-124), a CT Angiogram was obtained which was indeterminate. He was started on a heparin infusion.   Hospital Course by problem list: Principal Problem:   Venous thromboembolism (VTE) Active Problems:   Abnormal LFTs   Hypoalbuminemia   Acute deep vein thrombosis (DVT) of proximal vein of left lower extremity (HCC)   Severe protein-calorie malnutrition (HCC)   Provoked DVT of Left Lower ext without evidence of PE on CTA  had recent surgery on 11/3 (left knee lavage) and 11/5 (left shoulder lavage) for septic joints growing MSSA. DVT study showing left sided posterior tibial and peroneal vein  DVT. Does have significant swelling, erythema, and tenderness on left calf. CTA PE study was nonconclusive for PE. However, he does not have any hypoxia, chest pain or pressure. His tachycardia could be the result of pain. It's less likely that he has a PE. - will treat him for provoked DVT for 3 months. He did receive lovenox in the hospital. Will switch to xarelto on discharge, which he should continue until about 11/19/2014.   MSSA septic joints - left knee and left shoulder  s/p lavage on 11/3 (left knee) and 11/5 (left shoulder). Has been on Ancef via PICC line since last discharge.  - finish Ancef course for 4 weeks total to be completed on 07/08/15 - cont home PT  Elevated LFT - with no significant abdominal pain  AST and ALT very mildly elevated, ALK phos 263. This could be 2/2 to recent infecton - ordered GGT, and checking Hep C ab. This can be followed up outpatient.   Severe Protein calorie malnutrition - encouraged increasing protein intake  Discharge Vitals:   BP 132/68 mmHg  Pulse 120  Temp(Src) 100.2 F (37.9 C) (Oral)  Resp 22  SpO2 96%  Discharge Labs:  Results for orders placed or performed during the hospital encounter of 08/19/15 (from the past 24 hour(s))  Comprehensive metabolic panel     Status: Abnormal   Collection Time: 08/19/15  1:48 PM  Result Value Ref Range   Sodium 135 135 - 145 mmol/L   Potassium 4.1 3.5 - 5.1 mmol/L   Chloride 102 101 - 111 mmol/L   CO2 22 22 - 32 mmol/L   Glucose, Bld 197 (H) 65 - 99 mg/dL   BUN 15 6 - 20 mg/dL   Creatinine, Ser 0.74 0.61 - 1.24 mg/dL   Calcium 8.7 (L) 8.9 - 10.3 mg/dL   Total Protein 7.1 6.5 - 8.1 g/dL   Albumin 2.0 (L) 3.5 - 5.0 g/dL   AST 83 (H) 15 - 41 U/L   ALT 60 17 - 63 U/L   Alkaline Phosphatase 261 (H) 38 - 126 U/L   Total Bilirubin 0.9 0.3 - 1.2 mg/dL   GFR calc non Af Amer >60 >60 mL/min   GFR calc Af Amer >60 >60 mL/min   Anion gap 11 5 - 15  CBC with Differential     Status: Abnormal    Collection Time: 08/19/15  1:48 PM  Result Value Ref Range   WBC 10.9 (H) 4.0 - 10.5 K/uL   RBC 4.29 4.22 - 5.81 MIL/uL   Hemoglobin 13.0 13.0 - 17.0 g/dL   HCT 39.6 39.0 - 52.0 %   MCV 92.3 78.0 -  100.0 fL   MCH 30.3 26.0 - 34.0 pg   MCHC 32.8 30.0 - 36.0 g/dL   RDW 13.1 11.5 - 15.5 %   Platelets 410 (H) 150 - 400 K/uL   Neutrophils Relative % 84 %   Neutro Abs 9.2 (H) 1.7 - 7.7 K/uL   Lymphocytes Relative 8 %   Lymphs Abs 0.9 0.7 - 4.0 K/uL   Monocytes Relative 7 %   Monocytes Absolute 0.7 0.1 - 1.0 K/uL   Eosinophils Relative 1 %   Eosinophils Absolute 0.1 0.0 - 0.7 K/uL   Basophils Relative 0 %   Basophils Absolute 0.0 0.0 - 0.1 K/uL  Culture, blood (routine x 2)     Status: None (Preliminary result)   Collection Time: 08/19/15  1:48 PM  Result Value Ref Range   Specimen Description BLOOD LEFT HAND    Special Requests BOTTLES DRAWN AEROBIC AND ANAEROBIC 5CC    Culture NO GROWTH < 24 HOURS    Report Status PENDING   I-Stat CG4 Lactic Acid, ED (Not at [facility])     Status: Abnormal   Collection Time: 08/19/15  1:54 PM  Result Value Ref Range   Lactic Acid, Venous 2.48 (HH) 0.5 - 2.0 mmol/L   Comment NOTIFIED PHYSICIAN   Culture, blood (routine x 2)     Status: None (Preliminary result)   Collection Time: 08/19/15  3:08 PM  Result Value Ref Range   Specimen Description BLOOD LEFT HAND    Special Requests IN PEDIATRIC BOTTLE 1CC    Culture NO GROWTH < 24 HOURS    Report Status PENDING   I-Stat CG4 Lactic Acid, ED (Not at [facility])     Status: None   Collection Time: 08/19/15  4:18 PM  Result Value Ref Range   Lactic Acid, Venous 1.06 0.5 - 2.0 mmol/L  I-Stat CG4 Lactic Acid, ED     Status: None   Collection Time: 08/19/15  8:33 PM  Result Value Ref Range   Lactic Acid, Venous 1.12 0.5 - 2.0 mmol/L  CBC     Status: Abnormal   Collection Time: 08/20/15  2:15 AM  Result Value Ref Range   WBC 8.9 4.0 - 10.5 K/uL   RBC 3.79 (L) 4.22 - 5.81 MIL/uL   Hemoglobin 11.6 (L) 13.0 -  17.0 g/dL   HCT 35.2 (L) 39.0 - 52.0 %   MCV 92.9 78.0 - 100.0 fL   MCH 30.6 26.0 - 34.0 pg   MCHC 33.0 30.0 - 36.0 g/dL   RDW 13.1 11.5 - 15.5 %   Platelets 378 150 - 400 K/uL  Comprehensive metabolic panel     Status: Abnormal   Collection Time: 08/20/15  2:15 AM  Result Value Ref Range   Sodium 136 135 - 145 mmol/L   Potassium 4.2 3.5 - 5.1 mmol/L   Chloride 100 (L) 101 - 111 mmol/L   CO2 28 22 - 32 mmol/L   Glucose, Bld 145 (H) 65 - 99 mg/dL   BUN 11 6 - 20 mg/dL   Creatinine, Ser 0.73 0.61 - 1.24 mg/dL   Calcium 8.3 (L) 8.9 - 10.3 mg/dL   Total Protein 6.3 (L) 6.5 - 8.1 g/dL   Albumin 1.7 (L) 3.5 - 5.0 g/dL   AST 85 (H) 15 - 41 U/L   ALT 66 (H) 17 - 63 U/L   Alkaline Phosphatase 263 (H) 38 - 126 U/L   Total Bilirubin 0.9 0.3 - 1.2 mg/dL   GFR calc non  Af Amer >60 >60 mL/min   GFR calc Af Amer >60 >60 mL/min   Anion gap 8 5 - 15    Signed: Dellia Nims, MD 08/20/2015, 11:07 AM    Services Ordered on Discharge: has home PT already setup Equipment Ordered on Discharge:

## 2015-08-20 NOTE — Care Management Note (Addendum)
Case Management Note  Patient Details  Name: Raymond Peterson MRN: [id number] Date of Birth: September 22, 1971  Subjective/Objective:                    Action/Plan:  Pt being discharge home today with resumption of HH services. [facility] Expected Discharge Date:    11//12/16              Expected Discharge Plan:  Home w Home Health Services  In-House Referral:     Discharge planning Services  CM Consult  Post Acute Care Choice:  Resumption of Svcs/PTA Provider Choice offered to:   patient  DME Arranged:    DME Agency:     HH Arranged:  RN, PT, OT, IV Antibiotics HH Agency:  [facility]  Status of Service:     Medicare Important Message Given:    Date Medicare IM Given:    Medicare IM give by:    Date Additional Medicare IM Given:    Additional Medicare Important Message give by:     If discussed at Long Length of Stay Meetings, dates discussed:    Additional CommentsMichel Bickers:  Raymond Delair, RN 08/20/2015, 1:20 PM

## 2015-08-21 LAB — HEPATITIS C ANTIBODY: HCV Ab: <0.1 s/co ratio (ref 0.0–0.9)

## 2015-08-24 ENCOUNTER — Encounter (HOSPITAL_COMMUNITY): Payer: Self-pay | Admitting: General Practice

## 2015-08-24 ENCOUNTER — Inpatient Hospital Stay (HOSPITAL_COMMUNITY)
Admission: AD | Admit: 2015-08-24 | Discharge: 2015-08-30 | DRG: 486 | Disposition: A | Discharge status: Home Health | Payer: BLUE CROSS/BLUE SHIELD | Source: Ambulatory Visit | Attending: Orthopedic Surgery | Admitting: Orthopedic Surgery

## 2015-08-24 DIAGNOSIS — R748 Abnormal levels of other serum enzymes: Secondary | ICD-10-CM | POA: Diagnosis present

## 2015-08-24 DIAGNOSIS — I824Y2 Acute embolism and thrombosis of unspecified deep veins of left proximal lower extremity: Secondary | ICD-10-CM | POA: Diagnosis not present

## 2015-08-24 DIAGNOSIS — F329 Major depressive disorder, single episode, unspecified: Secondary | ICD-10-CM | POA: Diagnosis present

## 2015-08-24 DIAGNOSIS — I82492 Acute embolism and thrombosis of other specified deep vein of left lower extremity: Secondary | ICD-10-CM | POA: Diagnosis present

## 2015-08-24 DIAGNOSIS — Z86718 Personal history of other venous thrombosis and embolism: Secondary | ICD-10-CM

## 2015-08-24 DIAGNOSIS — M25562 Pain in left knee: Secondary | ICD-10-CM | POA: Diagnosis present

## 2015-08-24 DIAGNOSIS — I8290 Acute embolism and thrombosis of unspecified vein: Secondary | ICD-10-CM | POA: Diagnosis present

## 2015-08-24 DIAGNOSIS — R7989 Other specified abnormal findings of blood chemistry: Secondary | ICD-10-CM | POA: Diagnosis present

## 2015-08-24 DIAGNOSIS — I288 Other diseases of pulmonary vessels: Secondary | ICD-10-CM | POA: Diagnosis not present

## 2015-08-24 DIAGNOSIS — B9561 Methicillin susceptible Staphylococcus aureus infection as the cause of diseases classified elsewhere: Secondary | ICD-10-CM | POA: Diagnosis present

## 2015-08-24 DIAGNOSIS — R21 Rash and other nonspecific skin eruption: Secondary | ICD-10-CM | POA: Diagnosis not present

## 2015-08-24 DIAGNOSIS — M00062 Staphylococcal arthritis, left knee: Secondary | ICD-10-CM | POA: Diagnosis present

## 2015-08-24 DIAGNOSIS — I82409 Acute embolism and thrombosis of unspecified deep veins of unspecified lower extremity: Secondary | ICD-10-CM | POA: Diagnosis present

## 2015-08-24 DIAGNOSIS — R627 Adult failure to thrive: Secondary | ICD-10-CM | POA: Diagnosis present

## 2015-08-24 DIAGNOSIS — M170 Bilateral primary osteoarthritis of knee: Secondary | ICD-10-CM | POA: Diagnosis present

## 2015-08-24 DIAGNOSIS — M009 Pyogenic arthritis, unspecified: Secondary | ICD-10-CM | POA: Diagnosis present

## 2015-08-24 DIAGNOSIS — I82442 Acute embolism and thrombosis of left tibial vein: Secondary | ICD-10-CM | POA: Diagnosis present

## 2015-08-24 DIAGNOSIS — R7881 Bacteremia: Secondary | ICD-10-CM | POA: Diagnosis present

## 2015-08-24 DIAGNOSIS — R945 Abnormal results of liver function studies: Secondary | ICD-10-CM | POA: Diagnosis present

## 2015-08-24 DIAGNOSIS — M75102 Unspecified rotator cuff tear or rupture of left shoulder, not specified as traumatic: Secondary | ICD-10-CM | POA: Diagnosis not present

## 2015-08-24 DIAGNOSIS — M00862 Arthritis due to other bacteria, left knee: Secondary | ICD-10-CM | POA: Diagnosis not present

## 2015-08-24 DIAGNOSIS — M19012 Primary osteoarthritis, left shoulder: Secondary | ICD-10-CM | POA: Diagnosis present

## 2015-08-24 DIAGNOSIS — M00012 Staphylococcal arthritis, left shoulder: Secondary | ICD-10-CM | POA: Diagnosis present

## 2015-08-24 DIAGNOSIS — M19011 Primary osteoarthritis, right shoulder: Secondary | ICD-10-CM | POA: Diagnosis present

## 2015-08-24 DIAGNOSIS — Z7901 Long term (current) use of anticoagulants: Secondary | ICD-10-CM | POA: Diagnosis not present

## 2015-08-24 HISTORY — DX: Unspecified osteoarthritis, unspecified site: M19.90

## 2015-08-24 LAB — CBC WITH DIFFERENTIAL/PLATELET
Basophils Absolute: 0 10*3/uL (ref 0.0–0.1)
Basophils Relative: 0 %
Eosinophils Absolute: 0 10*3/uL (ref 0.0–0.7)
Eosinophils Relative: 0 %
HEMATOCRIT: 38.9 % — AB (ref 39.0–52.0)
HEMOGLOBIN: 12.6 g/dL — AB (ref 13.0–17.0)
LYMPHS ABS: 0.9 10*3/uL (ref 0.7–4.0)
LYMPHS PCT: 9 %
MCH: 29.9 pg (ref 26.0–34.0)
MCHC: 32.4 g/dL (ref 30.0–36.0)
MCV: 92.4 fL (ref 78.0–100.0)
MONOS PCT: 6 %
Monocytes Absolute: 0.6 10*3/uL (ref 0.1–1.0)
NEUTROS ABS: 8.7 10*3/uL — AB (ref 1.7–7.7)
NEUTROS PCT: 85 %
Platelets: 471 10*3/uL — ABNORMAL HIGH (ref 150–400)
RBC: 4.21 MIL/uL — AB (ref 4.22–5.81)
RDW: 13 % (ref 11.5–15.5)
WBC: 10.3 10*3/uL (ref 4.0–10.5)

## 2015-08-24 LAB — COMPREHENSIVE METABOLIC PANEL
ALBUMIN: 2.1 g/dL — AB (ref 3.5–5.0)
ALK PHOS: 289 U/L — AB (ref 38–126)
ALT: 44 U/L (ref 17–63)
ANION GAP: 10 (ref 5–15)
AST: 53 U/L — ABNORMAL HIGH (ref 15–41)
BUN: 14 mg/dL (ref 6–20)
CALCIUM: 8.7 mg/dL — AB (ref 8.9–10.3)
CO2: 25 mmol/L (ref 22–32)
CREATININE: 0.78 mg/dL (ref 0.61–1.24)
Chloride: 100 mmol/L — ABNORMAL LOW (ref 101–111)
GFR calc Af Amer: >60 mL/min (ref >60)
GFR calc non Af Amer: >60 mL/min (ref >60)
GLUCOSE: 161 mg/dL — AB (ref 65–99)
Potassium: 4.1 mmol/L (ref 3.5–5.1)
SODIUM: 135 mmol/L (ref 135–145)
Total Bilirubin: 0.8 mg/dL (ref 0.3–1.2)
Total Protein: 7.5 g/dL (ref 6.5–8.1)

## 2015-08-24 LAB — HEPARIN LEVEL (UNFRACTIONATED): HEPARIN UNFRACTIONATED: 0.68 [IU]/mL (ref 0.30–0.70)

## 2015-08-24 LAB — CULTURE, BLOOD (ROUTINE X 2)
CULTURE: NO GROWTH
Culture: NO GROWTH

## 2015-08-24 LAB — APTT: aPTT: 40 seconds — ABNORMAL HIGH (ref 24–37)

## 2015-08-24 MED ORDER — HEPARIN (PORCINE) IN NACL 100-0.45 UNIT/ML-% IJ SOLN
1600.0000 [IU]/h | INTRAMUSCULAR | Status: DC
  Administered 2015-08-24: 1600 [IU]/h via INTRAVENOUS
  Filled 2015-08-24: qty 250

## 2015-08-24 MED ORDER — ACETAMINOPHEN 650 MG RE SUPP: 650.0000 mg | Freq: Four times a day (QID) | RECTAL | Status: DC | PRN

## 2015-08-24 MED ORDER — BISACODYL 5 MG PO TBEC: 5.0000 mg | DELAYED_RELEASE_TABLET | Freq: Every day | ORAL | Status: DC | PRN

## 2015-08-24 MED ORDER — HYDROMORPHONE HCL 1 MG/ML IJ SOLN
1.0000 mg | INTRAMUSCULAR | Status: DC | PRN
  Administered 2015-08-24 – 2015-08-27 (×8): 1 mg via INTRAVENOUS
  Filled 2015-08-24 (×8): qty 1

## 2015-08-24 MED ORDER — OXYCODONE HCL 5 MG PO TABS
5.0000 mg | ORAL_TABLET | ORAL | Status: DC | PRN
  Administered 2015-08-24 – 2015-08-30 (×38): 10 mg via ORAL
  Filled 2015-08-24 (×39): qty 2

## 2015-08-24 MED ORDER — FLEET ENEMA 7-19 GM/118ML RE ENEM: 1.0000 | ENEMA | Freq: Once | RECTAL | Status: DC | PRN

## 2015-08-24 MED ORDER — PROMETHAZINE HCL 25 MG PO TABS: 12.5000 mg | ORAL_TABLET | Freq: Four times a day (QID) | ORAL | Status: DC | PRN

## 2015-08-24 MED ORDER — ACETAMINOPHEN 325 MG PO TABS: 650.0000 mg | ORAL_TABLET | Freq: Four times a day (QID) | ORAL | Status: DC | PRN

## 2015-08-24 MED ORDER — POLYETHYLENE GLYCOL 3350 17 G PO PACK: 17.0000 g | PACK | Freq: Every day | ORAL | Status: DC | PRN

## 2015-08-24 MED ORDER — DOCUSATE SODIUM 100 MG PO CAPS
100.0000 mg | ORAL_CAPSULE | Freq: Two times a day (BID) | ORAL | Status: DC
Start: 2015-08-24 — End: 2015-08-27
  Administered 2015-08-24 – 2015-08-26 (×5): 100 mg via ORAL
  Filled 2015-08-24 (×6): qty 1

## 2015-08-24 MED ORDER — LACTATED RINGERS IV SOLN
INTRAVENOUS | Status: DC
Start: 2015-08-24 — End: 2015-08-26
  Administered 2015-08-24 – 2015-08-25 (×2): via INTRAVENOUS

## 2015-08-24 MED ORDER — CEFAZOLIN SODIUM-DEXTROSE 2-3 GM-% IV SOLR
2.0000 g | INTRAVENOUS | Status: AC
  Administered 2015-08-25: 2 g via INTRAVENOUS
  Filled 2015-08-24: qty 50

## 2015-08-24 MED ORDER — CHLORHEXIDINE GLUCONATE 4 % EX LIQD
60.0000 mL | Freq: Once | CUTANEOUS | Status: AC
  Administered 2015-08-25: 4 via TOPICAL
  Filled 2015-08-24: qty 60

## 2015-08-24 MED ORDER — CEFAZOLIN SODIUM-DEXTROSE 2-3 GM-% IV SOLR
2.0000 g | Freq: Three times a day (TID) | INTRAVENOUS | Status: DC
  Administered 2015-08-24 – 2015-08-30 (×17): 2 g via INTRAVENOUS
  Filled 2015-08-24 (×23): qty 50

## 2015-08-24 MED ORDER — SODIUM CHLORIDE 0.9 % IJ SOLN
10.0000 mL | INTRAMUSCULAR | Status: DC | PRN
Start: 2015-08-24 — End: 2015-08-30
  Administered 2015-08-25 – 2015-08-30 (×4): 10 mL
  Filled 2015-08-24 (×4): qty 40

## 2015-08-24 NOTE — Consult Note (Signed)
[facility] Medical Consultation  DABID GODOWN GBT:[id number] DOB: 1971-06-14 DOA: 08/24/2015 PCP: Pcp Not In System   Requesting physician: Dr. Onnie Graham Date of consultation: 08/24/2015 Reason for consultation: Medical management   HPI  Mr. Langan is a 44 y/o male who presented today from Dr. Susie Cassette office with repeat septic arthritis of the left knee. 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 Infectious disease was consulted at that time and he was started on four weeks of IV Ancef. He was discharged following surgery and was readmitted 11/11 with worsening leg swelling and pain and was found to have a DVT and placed on Xarelto. Today he presented to Dr. Susie Cassette office for hospital follow-up and repeat aspiration of the knee revealed remaining infection and he was admitted for irrigation and debridement procedure tomorrow. Dr. Onnie Graham requested we consult for medical management of the patient.   Review of Systems:  He reports left knee pain, left shoulder pain. He denies SOB, productive cough, chest pain, fever, chills, N/V, abdominal pain.   Impression/Recommendations Active Problems:   Septic joint of left knee joint (HCC)   Venous thromboembolism (VTE)   Abnormal LFTs   DVT of leg (deep venous thrombosis) (HCC)   Septic Arthritis of Left Knee  - Patient is s/p left knee lavage on 11/3 and was placed on 4 weeks of IV Ancef at that time per ID  - Joint aspiration performed today in Dr. Susie Cassette office revealed continued infection and he was admitted for repeat irrigation and debridement tomorrow  - Case was discussed via curbside consultation with ID who recommend 4-6 weeks of IV Ancef with starting date tomorrow after surgery  - He is already followed by [facility] for outpatient antibiotics which will be resumed upon discharge     Acute DVT - Lower  extremity dopplers performed 11/11 with acute DVT involving the left posterior tibial and peroneal veins  - Patient was placed on Xarelto at that time which he reports compliance, and decreased swelling of his left leg - hold Xarelto since he will have surgery in am, start heparin gtt for now, hold in am. I discussed with pharmacy  Abnormal LFTs - Patient has persistent mild elevation in liver enzymes with AST 53, alk phos 289 today - Etiology unclear, may be related to recent infection. He denies abdominal pain, alcohol use  - Hep C negative, will check Hep B as well  ?Depression  - Patient with depressed affect and states that he is "feels very depressed" because of these ongoing medical problems - No active suicidal/homicidal ideation   - treat #1  I will followup again tomorrow. Please contact me if I can be of assistance in the meanwhile. Thank you for this consultation.  Past Medical History  Diagnosis Date  . PONV (postoperative nausea and vomiting)     "just once"  . Arthritis     oa  kness & shoulders    Past Surgical History  Procedure Laterality Date  . Rotator cuff repair Left   . Knee arthroscopy Right   . Shoulder arthroscopy Right   . Knee arthroscopy Left 08/11/2015    Procedure: Left Knee Arthroscopic Lavage;  Surgeon: Justice Britain, MD;  Location: [place];  Service: Orthopedics;  Laterality: Left;  . Fine needle aspiration Left 08/11/2015    Procedure: LEFT SHOULDER ASPIRATION;  Surgeon: Justice Britain, MD;  Location:  [place] OR;  Service: Orthopedics;  Laterality: Left;  . Shoulder arthroscopy Left 08/13/2015    Procedure: Arthroscopic Washout Of Left Shoulder;  Surgeon: Justice Britain, MD;  Location: [place];  Service: Orthopedics;  Laterality: Left;  Marland Kitchen Tympanostomy tube placement      as a child    Social History:  reports that he has never smoked. He has never used smokeless tobacco. He reports that he does not drink alcohol or use illicit drugs.  No Known Allergies    History reviewed, no pertinent history   Prior to Admission medications   Medication Sig Start Date End Date Taking? Authorizing Provider  ceFAZolin (ANCEF) 2-3 GM-% SOLR Inject 50 mLs (2 g total) into the vein every 8 (eight) hours. 08/20/15 09/07/15  Shela Leff, MD  docusate sodium (COLACE) 100 MG capsule Take 1 capsule (100 mg total) by mouth 2 (two) times daily. 08/20/15   Dellia Nims, MD  methocarbamol (ROAXIN) 500 MG tablet Take 1 tablet (500 mg total) by mouth every 8 (eight) hours as needed for muscle spasms. 08/16/15   Olivia Mackie Shuford, PA-C  oxyCODONE-acetaminophen (PERCOCET) 5-325 MG tablet Take 1-2 tablets by mouth every 4 (four) hours as needed. 08/15/15   Olivia Mackie Shuford, PA-C  polyethylene glycol (MIRALAX / GLYCOLAX) packet Take 17 g by mouth daily. 08/20/15   Dellia Nims, MD  Riboflavin 400 MG TABS Take 1 tablet by mouth every 8 (eight) hours.    Historical Provider, MD  Rivaroxaban (XARELTO) 15 MG TABS tablet Take 1 tablet (15 mg total) by mouth 2 (two) times daily with a meal. 08/20/15   Tasrif Ahmed, MD  rivaroxaban (XARELTO) 20 MG TABS tablet Take 1 tablet (20 mg total) by mouth daily with supper. 09/10/15   Tasrif Ahmed, MD  zolpidem (AMBIEN) 10 MG tablet Take 1 tablet (10 mg total) by mouth at bedtime as needed for sleep. 08/16/15 09/15/15  Jenetta Loges, PA-C   Physical Exam: Blood pressure 141/80, temperature 99.1 F (37.3 C), temperature source Oral, SpO2 100 %. Filed Vitals:   08/24/15 1225  BP: 141/80  Temp: 99.1 F (37.3 C)     General:  Pleasant male, lying in bed, NAD   Cardiovascular: RRR, no murmurs or gallops  Respiratory: Some mild coarse rhonchi bilaterally   Abdomen: Soft, non-tender  Skin: No rashes   Musculoskeletal: Limited ROM of left arm and left leg due to pain. Otherwise no focal deficits.   Psychiatric: Depressed affect, anxious   Neurologic: No gross neuro deficits.  Labs on Admission:  Basic Metabolic Panel:  Recent  Labs Lab 08/19/15 1348 08/20/15 0215 08/24/15 1321  NA 135 136 135  K 4.1 4.2 4.1  CL 102 100* 100*  CO2 22 28 25   GLUCOSE 197* 145* 161*  BUN 15 11 14   CREATININE 0.74 0.73 0.78  CALCIUM 8.7* 8.3* 8.7*   Liver Function Tests:  Recent Labs Lab 08/19/15 1348 08/20/15 0215 08/24/15 1321  AST 83* 85* 53*  ALT 60 66* 44  ALKPHOS 261* 263* 289*  BILITOT 0.9 0.9 0.8  PROT 7.1 6.3* 7.5  ALBUMIN 2.0* 1.7* 2.1*   CBC:  Recent Labs Lab 08/19/15 1348 08/20/15 0215 08/24/15 1321  WBC 10.9* 8.9 10.3  NEUTROABS 9.2*  --  8.7*  HGB 13.0 11.6* 12.6*  HCT 39.6 35.2* 38.9*  MCV 92.3 92.9 92.4  PLT 410* 378 471*    Radiological Exams on Admission: No results found.  EKG: None   Nachman Sundt M. Cruzita Lederer, MD [facility] [phone]  If 7PM-7AM, please contact night-coverage [email or web address] Password [email or web address] 08/24/2015, 3:16 PM

## 2015-08-24 NOTE — H&P (Signed)
PREOPERATIVE H&P  Chief Complaint: Ongoing severe left knee pain and general malaise and failure to thrive  HPI: Raymond Peterson is a 44 y.o. male who presents for evaluation and ongoing inpatient treatment to include repeat arthroscopic lavage of his left knee. 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 Infectious disease was consulted at that time and he was started on four weeks of IV Ancef. He was discharged following surgery and has been home with worsening swelling and poor mobility and was seen on Sunday and diagnosed with DVT and started on xarelto. He was seen today in our office where he looked and felt ill and repeat aspiration showed ongoing purulence. He was felt to require repeat admission for ongoing inpatient management   Past Medical History  Diagnosis Date  . PONV (postoperative nausea and vomiting)     "just once"  . Arthritis     oa  kness & shoulders    Past Surgical History  Procedure Laterality Date  . Rotator cuff repair Left   . Knee arthroscopy Right   . Shoulder arthroscopy Right   . Knee arthroscopy Left 08/11/2015    Procedure: Left Knee Arthroscopic Lavage;  Surgeon: Francena HanlyKevin Supple, MD;  [facility];  Service: Orthopedics;  Laterality: Left;  . Fine needle aspiration Left 08/11/2015    Procedure: LEFT SHOULDER ASPIRATION;  Surgeon: Francena HanlyKevin Supple, MD;  Location: [facility];  Service: Orthopedics;  Laterality: Left;  . Shoulder arthroscopy Left 08/13/2015    Procedure: Arthroscopic Washout Of Left Shoulder;  Surgeon: Francena HanlyKevin Supple, MD;  [facility];  Service: Orthopedics;  Laterality: Left;  Marland Kitchen. Tympanostomy tube placement      as a child    Social History   Social History  . Marital Status: Married    Spouse Name: N/A  . Number of Children: N/A  . Years of Education: N/A   Social History Main Topics  . Smoking status: Never Smoker   . Smokeless tobacco:  Never Used  . Alcohol Use: No  . Drug Use: No  . Sexual Activity: Not Asked   Other Topics Concern  . None   Social History Narrative   History reviewed. No pertinent family history. No Known Allergies Prior to Admission medications   Medication Sig Start Date End Date Taking? Authorizing Provider  ceFAZolin (ANCEF) 2-3 GM-% SOLR Inject 50 mLs (2 g total) into the vein every 8 (eight) hours. 08/20/15 09/07/15 Yes John GiovanniVasundhra Rathore, MD  docusate sodium (COLACE) 100 MG capsule Take 1 capsule (100 mg total) by mouth 2 (two) times daily. 08/20/15  Yes Tasrif Ahmed, MD  methocarbamol (ROBAXIN) 500 MG tablet Take 1 tablet (500 mg total) by mouth every 8 (eight) hours as needed for muscle spasms. 08/16/15  Yes Cyndi Montejano, PA-C  oxyCODONE-acetaminophen (PERCOCET) 5-325 MG tablet Take 1-2 tablets by mouth every 4 (four) hours as needed. 08/15/15  Yes Elmarie Devlin, PA-C  Rivaroxaban (XARELTO) 15 MG TABS tablet Take 1 tablet (15 mg total) by mouth 2 (two) times daily with a meal. 08/20/15  Yes Tasrif Ahmed, MD  zolpidem (AMBIEN) 10 MG tablet Take 1 tablet (10 mg total) by mouth at bedtime as needed for sleep. Patient taking differently: Take 10 mg by mouth at bedtime.  08/16/15 09/15/15 Yes Greer Koeppen, PA-C  polyethylene glycol (MIRALAX / GLYCOLAX) packet Take 17 g by mouth daily. 08/20/15   Hyacinth Meekerasrif Ahmed, MD  rivaroxaban (XARELTO) 20 MG TABS tablet Take 1 tablet (20 mg total) by mouth daily with supper. 09/10/15   Hyacinth Meeker, MD     Positive ROS: positive for ongoing severe left knee pain and and shoulder pain  All other systems have been reviewed and were otherwise negative with the exception of those mentioned in the HPI and as above.  Physical Exam: Filed Vitals:   08/24/15 1225  BP: 141/80  Temp: 99.1 F (37.3 C)    General: Alert, generally ill appearing and lethargic  Respiratory: No cyanosis, no use of accessory musculature GI: No organomegaly, abdomen is soft and  non-tender Skin: No lesions in the area of chief complaint Neurologic: Sensation intact distally Psychiatric: Patient is competent for consent with normal mood and affect   MUSCULOSKELETAL: left knee with ongoing large effusion and murky aspirate  Assessment/Plan: INFECTED LEFT KNEE  Plan for Procedure(s): IRRIGATION AND DEBRIDEMENT LEFT KNEE   The risks benefits and treatment options were reviewed including possible complications of bleeding, infection, neurovascular injury, ongoing pain, anesthetic complication, and possible need for additional surgery. The patient understands, accepts and agrees to proceed with the planned procedure.  Jayant Kriz, PA-C 08/24/2015 4:56 PM Contact # [phone]

## 2015-08-24 NOTE — Progress Notes (Signed)
[facility]  Patient Status: Active patient with [facility] prior to this readmission  [facility] is providing the following services: HHRN and Home Infusion Pharmacy Team for home IV ABX. [facility] hospital team will follow Mr. Zentz while an inpatient to support transition home as ordered by MD. Met with pt today at time of admission to advise of same.   If patient discharges after hours, please call [phone].   Larry Sierras 08/24/2015, 2:48 PM

## 2015-08-24 NOTE — Progress Notes (Signed)
ANTICOAGULATION CONSULT NOTE - Initial Consult  Pharmacy Consult for Heparin (while Xarelto on hold for surgery) Indication: h/o recently diagnosed LLE DVT (08/19/15)  No Known Allergies  Patient Measurements: Height:  (180.3 cm) (from 08/11/15 @ 08:48) Weight: 255 lb 1.2 oz (115.7 kg) (from 08/11/15 @ 08:48) IBW/kg (Calculated) : 75.3 Heparin Dosing Weight: 100.6 kg  Vital Signs: Temp: 99.1 F (37.3 C) (11/16 1225) Temp Source: Oral (11/16 1225) BP: 141/80 mmHg (11/16 1225)  Labs:  Recent Labs  08/24/15 1321  HGB 12.6*  HCT 38.9*  PLT 471*  CREATININE 0.78    Estimated Creatinine Clearance: 152.5 mL/min (by C-G formula based on Cr of 0.78).   Medical History: Past Medical History  Diagnosis Date  . PONV (postoperative nausea and vomiting)     "just once"  . Arthritis     oa  kness & shoulders     Medications:  Prescriptions prior to admission  Medication Sig Dispense Refill Last Dose  . ceFAZolin (ANCEF) 2-3 GM-% SOLR Inject 50 mLs (2 g total) into the vein every 8 (eight) hours. 50 mL 90 08/24/2015 at 1430  . docusate sodium (COLACE) 100 MG capsule Take 1 capsule (100 mg total) by mouth 2 (two) times daily. 10 capsule 0 08/24/2015 at am  . methocarbamol (ROBAXIN) 500 MG tablet Take 1 tablet (500 mg total) by mouth every 8 (eight) hours as needed for muscle spasms. 50 tablet 1 08/23/2015 at Unknown time  . oxyCODONE-acetaminophen (PERCOCET) 5-325 MG tablet Take 1-2 tablets by mouth every 4 (four) hours as needed. 60 tablet 0 08/24/2015 at Unknown time  . Rivaroxaban (XARELTO) 15 MG TABS tablet Take 1 tablet (15 mg total) by mouth 2 (two) times daily with a meal. 42 tablet 0 08/24/2015 at am  . zolpidem (AMBIEN) 10 MG tablet Take 1 tablet (10 mg total) by mouth at bedtime as needed for sleep. (Patient taking differently: Take 10 mg by mouth at bedtime. ) 30 tablet 1 08/23/2015 at Unknown time  . polyethylene glycol (MIRALAX / GLYCOLAX) packet Take 17 g by mouth  daily. 14 each 0   . [START ON 09/10/2015] rivaroxaban (XARELTO) 20 MG TABS tablet Take 1 tablet (20 mg total) by mouth daily with supper. 30 tablet 0 not started   Scheduled:  . [START ON 08/25/2015]  ceFAZolin (ANCEF) IV  2 g Intravenous On Call to OR  .  ceFAZolin (ANCEF) IV  2 g Intravenous 3 times per day  . chlorhexidine  60 mL Topical Once  . docusate sodium  100 mg Oral BID    Assessment: 44 y.o male who presents for evaluation and ongoing inpatient treatment to include repeat arthroscopic lavage of his left knee.  He was recently diagnosed (08/11/15) with MSSA septic arthritis of both the left knee and left shoulder and started on 4 weeks of IV Ancef.   He was also diagnosed with LLE DVT on 08/19/15 and started on VTE treatment with Xarelto 15 mg PO BID x 21 days then to reduce dose on 09/10/15 to  once daily with supper. The patient reports he last took Xarelto  this AM approximately 8am.  Pharmacy will begin the IV heparin infusion 12 hrs after his last Xarelto dose.  Baseline aPTT and heparin level stat to aid in monitoring.  We will likely need to monitor heparin using the aPTT as heparin levels likley elevated by the xarelto.   SCr 0.78, CrCl ~ >100 ml/min, Hgb 12.6, PLTC 471. No bleeding noted.  He is scheduled for surgery I&D tomorrow @9am . Orders are to discontinue heparin 4-6 h prior to surgery.   Goal of Therapy:  Heparin level 0.3-0.7 units/ml aPTT 66-102 seconds Monitor platelets by anticoagulation protocol: Yes   Plan:  Baseline aPTT and heparin level STAT At 8pm start IV heparin drip 1600 units/hr Check 6 hour aPTT and heparin level. Plan to adjust heparin rate based on aPTT until effect of Xarelto on the heparin level diminishes. Daily aPTT and heparin level.  Discontinue heparin 4-6 h prior to surgery (surgery at [facility] 11[facility]17).  F/u after surgery for restart of IV heparin or Xarelto.     Thank you for allowing pharmacy to be part of this patients care  team. Noah Delaineuth Jeananne Bedwell, RPh Clinical Pharmacist Pager: [phone] 08/24/2015,5:31 PM

## 2015-08-25 ENCOUNTER — Inpatient Hospital Stay (HOSPITAL_COMMUNITY): Payer: BLUE CROSS/BLUE SHIELD | Admitting: Certified Registered Nurse Anesthetist

## 2015-08-25 ENCOUNTER — Ambulatory Visit: Payer: BLUE CROSS/BLUE SHIELD | Admitting: Internal Medicine

## 2015-08-25 ENCOUNTER — Encounter (HOSPITAL_COMMUNITY)
Admission: AD | Disposition: A | Discharge status: Home Health | Payer: Self-pay | Source: Ambulatory Visit | Attending: Orthopedic Surgery

## 2015-08-25 DIAGNOSIS — M00062 Staphylococcal arthritis, left knee: Principal | ICD-10-CM

## 2015-08-25 DIAGNOSIS — R7989 Other specified abnormal findings of blood chemistry: Secondary | ICD-10-CM

## 2015-08-25 DIAGNOSIS — I8290 Acute embolism and thrombosis of unspecified vein: Secondary | ICD-10-CM

## 2015-08-25 DIAGNOSIS — I824Y2 Acute embolism and thrombosis of unspecified deep veins of left proximal lower extremity: Secondary | ICD-10-CM

## 2015-08-25 HISTORY — PX: I & D EXTREMITY: SHX5045

## 2015-08-25 LAB — HEPARIN LEVEL (UNFRACTIONATED): Heparin Unfractionated: 0.28 IU/mL — ABNORMAL LOW (ref 0.30–0.70)

## 2015-08-25 LAB — APTT: aPTT: 40 seconds — ABNORMAL HIGH (ref 24–37)

## 2015-08-25 SURGERY — IRRIGATION AND DEBRIDEMENT EXTREMITY
Anesthesia: General | Site: Knee | Laterality: Left

## 2015-08-25 MED ORDER — ONDANSETRON HCL 4 MG/2ML IJ SOLN: 4.0000 mg | Freq: Four times a day (QID) | INTRAMUSCULAR | Status: DC | PRN

## 2015-08-25 MED ORDER — MIDAZOLAM HCL 2 MG/2ML IJ SOLN
INTRAMUSCULAR | Status: AC
  Filled 2015-08-25: qty 2

## 2015-08-25 MED ORDER — PROPOFOL 10 MG/ML IV BOLUS
INTRAVENOUS | Status: DC | PRN
  Administered 2015-08-25: 200 mg via INTRAVENOUS

## 2015-08-25 MED ORDER — METHOCARBAMOL 1000 MG/10ML IJ SOLN
500.0000 mg | Freq: Four times a day (QID) | INTRAVENOUS | Status: DC | PRN
  Administered 2015-08-25: 500 mg via INTRAVENOUS
  Filled 2015-08-25 (×3): qty 5

## 2015-08-25 MED ORDER — LIDOCAINE HCL (CARDIAC) 20 MG/ML IV SOLN
INTRAVENOUS | Status: DC | PRN
Start: 2015-08-25 — End: 2015-08-25
  Administered 2015-08-25: 60 mg via INTRAVENOUS

## 2015-08-25 MED ORDER — RIVAROXABAN 15 MG PO TABS
15.0000 mg | ORAL_TABLET | Freq: Two times a day (BID) | ORAL | Status: DC
  Administered 2015-08-25 – 2015-08-30 (×10): 15 mg via ORAL
  Filled 2015-08-25 (×14): qty 1

## 2015-08-25 MED ORDER — LACTATED RINGERS IV SOLN
INTRAVENOUS | Status: DC
Start: 2015-08-25 — End: 2015-08-30
  Administered 2015-08-25 (×2): via INTRAVENOUS

## 2015-08-25 MED ORDER — MIDAZOLAM HCL 2 MG/2ML IJ SOLN
INTRAMUSCULAR | Status: AC
  Administered 2015-08-25: 0.5 mg
  Filled 2015-08-25: qty 2

## 2015-08-25 MED ORDER — CEFAZOLIN SODIUM-DEXTROSE 2-3 GM-% IV SOLR: 2.0000 g | Freq: Three times a day (TID) | INTRAVENOUS | Status: DC

## 2015-08-25 MED ORDER — METOCLOPRAMIDE HCL 5 MG/ML IJ SOLN: 5.0000 mg | Freq: Three times a day (TID) | INTRAMUSCULAR | Status: DC | PRN

## 2015-08-25 MED ORDER — HYDROMORPHONE HCL 1 MG/ML IJ SOLN
INTRAMUSCULAR | Status: AC
Start: 2015-08-25 — End: 2015-08-25
  Filled 2015-08-25: qty 1

## 2015-08-25 MED ORDER — PROPOFOL 10 MG/ML IV BOLUS
INTRAVENOUS | Status: AC
  Filled 2015-08-25: qty 20

## 2015-08-25 MED ORDER — ONDANSETRON HCL 4 MG PO TABS: 4.0000 mg | ORAL_TABLET | Freq: Four times a day (QID) | ORAL | Status: DC | PRN

## 2015-08-25 MED ORDER — METHOCARBAMOL 500 MG PO TABS
500.0000 mg | ORAL_TABLET | Freq: Three times a day (TID) | ORAL | Status: DC | PRN
  Administered 2015-08-26 – 2015-08-30 (×9): 500 mg via ORAL
  Filled 2015-08-25 (×9): qty 1

## 2015-08-25 MED ORDER — RIVAROXABAN 15 MG PO TABS
15.0000 mg | ORAL_TABLET | Freq: Two times a day (BID) | ORAL | Status: DC
  Filled 2015-08-25 (×2): qty 1

## 2015-08-25 MED ORDER — DIPHENHYDRAMINE HCL 12.5 MG/5ML PO ELIX
12.5000 mg | ORAL_SOLUTION | ORAL | Status: DC | PRN
  Administered 2015-08-26: 25 mg via ORAL
  Filled 2015-08-25 (×2): qty 10

## 2015-08-25 MED ORDER — HYDROMORPHONE HCL 1 MG/ML IJ SOLN
0.5000 mg | Freq: Once | INTRAMUSCULAR | Status: AC
  Administered 2015-08-25: 0.5 mg via INTRAVENOUS

## 2015-08-25 MED ORDER — ONDANSETRON HCL 4 MG/2ML IJ SOLN
INTRAMUSCULAR | Status: DC | PRN
  Administered 2015-08-25: 4 mg via INTRAVENOUS

## 2015-08-25 MED ORDER — METHOCARBAMOL 500 MG PO TABS
500.0000 mg | ORAL_TABLET | Freq: Four times a day (QID) | ORAL | Status: DC | PRN
  Administered 2015-08-25: 500 mg via ORAL

## 2015-08-25 MED ORDER — OXYCODONE HCL 5 MG PO TABS
5.0000 mg | ORAL_TABLET | Freq: Once | ORAL | Status: AC
  Administered 2015-08-25: 5 mg via ORAL

## 2015-08-25 MED ORDER — FENTANYL CITRATE (PF) 250 MCG/5ML IJ SOLN
INTRAMUSCULAR | Status: AC
  Filled 2015-08-25: qty 5

## 2015-08-25 MED ORDER — OXYCODONE HCL 5 MG PO TABS
ORAL_TABLET | ORAL | Status: AC
  Filled 2015-08-25: qty 1

## 2015-08-25 MED ORDER — DOCUSATE SODIUM 100 MG PO CAPS: 100.0000 mg | ORAL_CAPSULE | Freq: Two times a day (BID) | ORAL | Status: DC

## 2015-08-25 MED ORDER — ACETAMINOPHEN 650 MG RE SUPP: 650.0000 mg | Freq: Four times a day (QID) | RECTAL | Status: DC | PRN

## 2015-08-25 MED ORDER — RIVAROXABAN 20 MG PO TABS: 20.0000 mg | ORAL_TABLET | Freq: Every day | ORAL | Status: DC

## 2015-08-25 MED ORDER — METHOCARBAMOL 500 MG PO TABS
ORAL_TABLET | ORAL | Status: AC
  Filled 2015-08-25: qty 1

## 2015-08-25 MED ORDER — FENTANYL CITRATE (PF) 100 MCG/2ML IJ SOLN
INTRAMUSCULAR | Status: DC | PRN
  Administered 2015-08-25 (×2): 50 ug via INTRAVENOUS
  Administered 2015-08-25: 100 ug via INTRAVENOUS
  Administered 2015-08-25: 50 ug via INTRAVENOUS

## 2015-08-25 MED ORDER — METOCLOPRAMIDE HCL 5 MG PO TABS: 5.0000 mg | ORAL_TABLET | Freq: Three times a day (TID) | ORAL | Status: DC | PRN

## 2015-08-25 MED ORDER — EPINEPHRINE HCL 1 MG/ML IJ SOLN
0.5000 ug/min | INTRAVENOUS | Status: DC
  Filled 2015-08-25: qty 4

## 2015-08-25 MED ORDER — ZOLPIDEM TARTRATE 5 MG PO TABS
10.0000 mg | ORAL_TABLET | Freq: Every day | ORAL | Status: DC
  Administered 2015-08-25 – 2015-08-29 (×5): 10 mg via ORAL
  Filled 2015-08-25 (×5): qty 2

## 2015-08-25 MED ORDER — HYDROMORPHONE HCL 1 MG/ML IJ SOLN
INTRAMUSCULAR | Status: AC
  Filled 2015-08-25: qty 2

## 2015-08-25 MED ORDER — HYDROMORPHONE HCL 1 MG/ML IJ SOLN
0.2500 mg | INTRAMUSCULAR | Status: DC | PRN
  Administered 2015-08-25 (×4): 0.5 mg via INTRAVENOUS

## 2015-08-25 MED ORDER — ACETAMINOPHEN 325 MG PO TABS
650.0000 mg | ORAL_TABLET | Freq: Four times a day (QID) | ORAL | Status: DC | PRN
  Administered 2015-08-26 – 2015-08-30 (×4): 650 mg via ORAL
  Filled 2015-08-25 (×4): qty 2

## 2015-08-25 MED ORDER — MIDAZOLAM HCL 2 MG/2ML IJ SOLN
0.5000 mg | Freq: Once | INTRAMUSCULAR | Status: AC
  Administered 2015-08-25: 0.5 mg via INTRAVENOUS

## 2015-08-25 SURGICAL SUPPLY — 55 items
BLADE CUTTER MENIS 5.5 (BLADE) ×3 IMPLANT
BLADE GREAT WHITE 4.2 (BLADE) ×2 IMPLANT
BLADE GREAT WHITE 4.2MM (BLADE) ×1
BLADE SURG 10 STRL SS (BLADE) ×3 IMPLANT
BNDG COHESIVE 4X5 TAN STRL (GAUZE/BANDAGES/DRESSINGS) ×3 IMPLANT
BNDG COHESIVE 6X5 TAN STRL LF (GAUZE/BANDAGES/DRESSINGS) ×9 IMPLANT
BNDG GAUZE STRTCH 6 (GAUZE/BANDAGES/DRESSINGS) ×9 IMPLANT
CANNULA ACUFLEX KIT 5X76 (CANNULA) ×3 IMPLANT
COTTON STERILE ROLL (GAUZE/BANDAGES/DRESSINGS) ×3 IMPLANT
COVER SURGICAL LIGHT HANDLE (MISCELLANEOUS) ×3 IMPLANT
CUFF TOURNIQUET SINGLE 18IN (TOURNIQUET CUFF) IMPLANT
CUFF TOURNIQUET SINGLE 24IN (TOURNIQUET CUFF) IMPLANT
CUFF TOURNIQUET SINGLE 34IN LL (TOURNIQUET CUFF) IMPLANT
CUFF TOURNIQUET SINGLE 44IN (TOURNIQUET CUFF) IMPLANT
DRAPE U-SHAPE 47X51 STRL (DRAPES) IMPLANT
DRSG ADAPTIC 3X8 NADH LF (GAUZE/BANDAGES/DRESSINGS) ×3 IMPLANT
DRSG PAD ABDOMINAL 8X10 ST (GAUZE/BANDAGES/DRESSINGS) ×6 IMPLANT
DURAPREP 26ML APPLICATOR (WOUND CARE) ×3 IMPLANT
ELECT CAUTERY BLADE 6.4 (BLADE) IMPLANT
ELECT REM PT RETURN 9FT ADLT (ELECTROSURGICAL)
ELECTRODE REM PT RTRN 9FT ADLT (ELECTROSURGICAL) IMPLANT
FLUID NSS /IRRIG 3000 ML XXX (IV SOLUTION) ×9 IMPLANT
GAUZE SPONGE 4X4 12PLY STRL (GAUZE/BANDAGES/DRESSINGS) ×6 IMPLANT
GLOVE BIO SURGEON STRL SZ7.5 (GLOVE) ×3 IMPLANT
GLOVE BIO SURGEON STRL SZ8 (GLOVE) ×3 IMPLANT
GLOVE EUDERMIC 7 POWDERFREE (GLOVE) ×3 IMPLANT
GLOVE SS BIOGEL STRL SZ 7.5 (GLOVE) ×1 IMPLANT
GLOVE SUPERSENSE BIOGEL SZ 7.5 (GLOVE) ×2
GOWN STRL REUS W/ TWL LRG LVL3 (GOWN DISPOSABLE) ×1 IMPLANT
GOWN STRL REUS W/ TWL XL LVL3 (GOWN DISPOSABLE) ×2 IMPLANT
GOWN STRL REUS W/TWL LRG LVL3 (GOWN DISPOSABLE) ×2
GOWN STRL REUS W/TWL XL LVL3 (GOWN DISPOSABLE) ×4
HANDPIECE INTERPULSE COAX TIP (DISPOSABLE)
KIT BASIN OR (CUSTOM PROCEDURE TRAY) ×3 IMPLANT
KIT ROOM TURNOVER OR (KITS) ×3 IMPLANT
MANIFOLD NEPTUNE II (INSTRUMENTS) ×3 IMPLANT
NS IRRIG 1000ML POUR BTL (IV SOLUTION) ×3 IMPLANT
PACK ORTHO EXTREMITY (CUSTOM PROCEDURE TRAY) ×3 IMPLANT
PAD ARMBOARD 7.5X6 YLW CONV (MISCELLANEOUS) ×6 IMPLANT
PADDING CAST COTTON 6X4 STRL (CAST SUPPLIES) ×3 IMPLANT
SET ARTHROSCOPY TUBING (MISCELLANEOUS) ×2
SET ARTHROSCOPY TUBING LN (MISCELLANEOUS) ×1 IMPLANT
SET HNDPC FAN SPRY TIP SCT (DISPOSABLE) IMPLANT
SPONGE LAP 18X18 X RAY DECT (DISPOSABLE) ×3 IMPLANT
STOCKINETTE IMPERVIOUS 9X36 MD (GAUZE/BANDAGES/DRESSINGS) ×3 IMPLANT
STOCKINETTE IMPERVIOUS LG (DRAPES) ×3 IMPLANT
SUT ETHILON 2 0 FS 18 (SUTURE) ×3 IMPLANT
TOWEL OR 17X24 6PK STRL BLUE (TOWEL DISPOSABLE) ×3 IMPLANT
TOWEL OR 17X26 10 PK STRL BLUE (TOWEL DISPOSABLE) ×3 IMPLANT
TUBE ANAEROBIC SPECIMEN COL (MISCELLANEOUS) IMPLANT
TUBE CONNECTING 12'X1/4 (SUCTIONS) ×1
TUBE CONNECTING 12X1/4 (SUCTIONS) ×2 IMPLANT
UNDERPAD 30X30 INCONTINENT (UNDERPADS AND DIAPERS) ×3 IMPLANT
WATER STERILE IRR 1000ML POUR (IV SOLUTION) ×3 IMPLANT
YANKAUER SUCT BULB TIP NO VENT (SUCTIONS) ×3 IMPLANT

## 2015-08-25 NOTE — Consult Note (Addendum)
[facility]  Total days of antibiotics 13        Day 13 cefazolin               Reason for Consult: MSSA septic arthritis    Referring Physician: ghimire  Active Problems:   Septic joint of left knee joint ([place])   Venous thromboembolism (VTE)   Abnormal LFTs   DVT of leg (deep venous thrombosis) (HCC)    HPI: Raymond Peterson is a 44 y.o. male  with with a history of work related left shoulder rotator cuff injury  followed by Dr. Onnie Graham from Orthopedics. The patient had complained of worsening left shoulder pain and left knee pain (cell ct of 13,500 98%N) which was found to have MSSA septic arthritis. cx were only sent from shoulder aspirate which grew MSSA. Blood cx from 11/5 were NGTD but he was on abtx at this time. He is s/p two arthroscopic lavages on 11/3 and 11/5 for septic joints in the left glenohumeral joint and left knee. Other work up included TTE on 11/7 which was negative for murmur or vegetations. He was discharged with a PICC for 4 week course of cefazolin on 11/8. His course was complicated by left leg swelling that was c/w with Left lower leg DVT by U/S showing thrombus involving the posterior tibial and peroneal veins and he was subsequently started on xarelto.   He was seen in Dr. Susie Cassette office on 11/16, still complaining of worsening left knee pain & feeling poorly. He  underwent arthrocentesis in the office that was described as a purulent aspiration, with cell count of 1,425 with 93%N.  He was admitted on 11/16 in order to undergo 2nd I x D of his left knee. His WBC was 10.3 with 85% N. plt elevated at 471K. Remained afebrile. OR note from 11/17 suggests joint had ongoing infection but unclear if repeat cultures were sent.  Past Medical History  Diagnosis Date  . PONV (postoperative nausea and vomiting)     "just once"  . Arthritis     oa  kness & shoulders     Allergies: No Known Allergies  MEDICATIONS: .  ceFAZolin (ANCEF) IV  2 g  Intravenous 3 times per day  . docusate sodium  100 mg Oral BID  . HYDROmorphone      . HYDROmorphone      . methocarbamol      . oxyCODONE      . rivaroxaban  15 mg Oral BID WC   Followed by  . [START ON 09/10/2015] rivaroxaban  20 mg Oral Q supper  . zolpidem  10 mg Oral QHS    Social History  Substance Use Topics  . Smoking status: Never Smoker   . Smokeless tobacco: Never Used  . Alcohol Use: No    History reviewed. No pertinent family history.   Review of Systems  Constitutional: Negative for fever, chills, diaphoresis, activity change, appetite change, fatigue and unexpected weight change.  HENT: Negative for congestion, sore throat, rhinorrhea, sneezing, trouble swallowing and sinus pressure.  Eyes: Negative for photophobia and visual disturbance.  Respiratory: Negative for cough, chest tightness, shortness of breath, wheezing and stridor.  Cardiovascular: Negative for chest pain, palpitations and leg swelling.  Gastrointestinal: Negative for nausea, vomiting, abdominal pain, diarrhea, constipation, blood in stool, abdominal distention and anal bleeding.  Genitourinary: Negative for dysuria, hematuria, flank pain and difficulty urinating.  Musculoskeletal: + left knee pain, and shoulder limited pain  Skin: Negative for color change, pallor, rash and wound.  Neurological: Negative for dizziness, tremors, weakness and light-headedness.  Hematological: Negative for adenopathy. Does not bruise/bleed easily.  Psychiatric/Behavioral: Negative for behavioral problems, confusion, sleep disturbance, dysphoric mood, decreased concentration and agitation.     OBJECTIVE: Temp:  [98 F (36.7 C)-98.7 F (37.1 C)] 98.3 F (36.8 C) (11/17 1300) Pulse Rate:  [88-103] 98 (11/17 1300) Resp:  [14-23] 16 (11/17 1300) BP: (123-146)/(72-92) 133/78 mmHg (11/17 1300) SpO2:  [93 %-97 %] 95 % (11/17 1300) Weight:  [255 lb 1.2 oz (115.7 kg)] 255 lb 1.2 oz (115.7 kg) (11/16 1700) Physical  Exam  Constitutional: He is oriented to person, place, and time. He appears well-developed and well-nourished. No distress.  HENT:  Mouth/Throat: Oropharynx is clear and moist. No oropharyngeal exudate.  Cardiovascular: Normal rate, regular rhythm and normal heart sounds. Exam reveals no gallop and no friction rub.  No murmur heard.  Pulmonary/Chest: Effort normal and breath sounds normal. No respiratory distress. He has no wheezes.  Abdominal: Soft. Bowel sounds are normal. He exhibits no distension. There is no tenderness.  Lymphadenopathy:  He has no cervical adenopathy.  Neurological: He is alert and oriented to person, place, and time.  Skin: Skin is warm and dry. No rash noted. No erythema.  Ext: left knee wrapped with accordian drain in place. Limited range of motion with left upper arm Psychiatric:  His behavior is normal. Flat affect    LABS: Results for orders placed or performed during the hospital encounter of 08/24/15 (from the past 48 hour(s))  Comprehensive metabolic panel     Status: Abnormal   Collection Time: 08/24/15  1:21 PM  Result Value Ref Range   Sodium 135 135 - 145 mmol/L   Potassium 4.1 3.5 - 5.1 mmol/L   Chloride 100 (L) 101 - 111 mmol/L   CO2 25 22 - 32 mmol/L   Glucose, Bld 161 (H) 65 - 99 mg/dL   BUN 14 6 - 20 mg/dL   Creatinine, Ser 0.78 0.61 - 1.24 mg/dL   Calcium 8.7 (L) 8.9 - 10.3 mg/dL   Total Protein 7.5 6.5 - 8.1 g/dL   Albumin 2.1 (L) 3.5 - 5.0 g/dL   AST 53 (H) 15 - 41 U/L   ALT 44 17 - 63 U/L   Alkaline Phosphatase 289 (H) 38 - 126 U/L   Total Bilirubin 0.8 0.3 - 1.2 mg/dL   GFR calc non Af Amer >60 >60 mL/min   GFR calc Af Amer >60 >60 mL/min    Comment: (NOTE) The eGFR has been calculated using the CKD EPI equation. This calculation has not been validated in all clinical situations. eGFR's persistently <60 mL/min signify possible Chronic Kidney Disease.    Anion gap 10 5 - 15  CBC WITH DIFFERENTIAL     Status: Abnormal    Collection Time: 08/24/15  1:21 PM  Result Value Ref Range   WBC 10.3 4.0 - 10.5 K/uL   RBC 4.21 (L) 4.22 - 5.81 MIL/uL   Hemoglobin 12.6 (L) 13.0 - 17.0 g/dL   HCT 38.9 (L) 39.0 - 52.0 %   MCV 92.4 78.0 - 100.0 fL   MCH 29.9 26.0 - 34.0 pg   MCHC 32.4 30.0 - 36.0 g/dL   RDW 13.0 11.5 - 15.5 %   Platelets 471 (H) 150 - 400 K/uL   Neutrophils Relative % 85 %   Neutro Abs 8.7 (H) 1.7 - 7.7 K/uL   Lymphocytes Relative 9 %  Lymphs Abs 0.9 0.7 - 4.0 K/uL   Monocytes Relative 6 %   Monocytes Absolute 0.6 0.1 - 1.0 K/uL   Eosinophils Relative 0 %   Eosinophils Absolute 0.0 0.0 - 0.7 K/uL   Basophils Relative 0 %   Basophils Absolute 0.0 0.0 - 0.1 K/uL  Heparin level (unfractionated)     Status: None   Collection Time: 08/24/15  8:40 PM  Result Value Ref Range   Heparin Unfractionated 0.68 0.30 - 0.70 IU/mL    Comment:        IF HEPARIN RESULTS ARE BELOW EXPECTED VALUES, AND PATIENT DOSAGE HAS BEEN CONFIRMED, SUGGEST FOLLOW UP TESTING OF ANTITHROMBIN III LEVELS.   APTT     Status: Abnormal   Collection Time: 08/24/15  8:40 PM  Result Value Ref Range   aPTT 40 (H) 24 - 37 seconds    Comment:        IF BASELINE aPTT IS ELEVATED, SUGGEST PATIENT RISK ASSESSMENT BE USED TO DETERMINE APPROPRIATE ANTICOAGULANT THERAPY.   APTT     Status: Abnormal   Collection Time: 08/25/15  6:05 AM  Result Value Ref Range   aPTT 40 (H) 24 - 37 seconds    Comment:        IF BASELINE aPTT IS ELEVATED, SUGGEST PATIENT RISK ASSESSMENT BE USED TO DETERMINE APPROPRIATE ANTICOAGULANT THERAPY.   Heparin level (unfractionated)     Status: Abnormal   Collection Time: 08/25/15  6:08 AM  Result Value Ref Range   Heparin Unfractionated 0.28 (L) 0.30 - 0.70 IU/mL    Comment:        IF HEPARIN RESULTS ARE BELOW EXPECTED VALUES, AND PATIENT DOSAGE HAS BEEN CONFIRMED, SUGGEST FOLLOW UP TESTING OF ANTITHROMBIN III LEVELS.     MICRO: ? Pending from OR  HISTORICAL [place] 11/3 aspirate cx  MSSA 11/5 blood cx NGTD  Assessment/Plan:  44yo M with left shoulder and left knee MSSA septic arthritis since 11/4 though has had worsening pain to left knee and repeat arthrocentesis suggestive of ongoing infection s/p 2nd I x D of left knee  - recommend continue on cefazolin 2gm IV Q 8hr x 6 wk using 11/18 as day 1.   - recommend to get TEE, to see if he has any evidence of NV endocarditis. Though he is being treated with appropriate length of therapy  - elevated alk phos = cholestasis picture could be due to beta lactams. No symptoms. Continue to monitor vs. Consider limited RUQ u/s  - DVT hx = continue with xarelto  Caren Griffins B. [facility] [phone]

## 2015-08-25 NOTE — Anesthesia Procedure Notes (Signed)
Procedure Name: LMA Insertion Date/Time: 08/25/2015 10:15 AM Performed by: Virgel GessHOLTZMAN, Talen Poser LEFFEW Pre-anesthesia Checklist: Patient identified, Emergency Drugs available, Suction available, Timeout performed and Patient being monitored Patient Re-evaluated:Patient Re-evaluated prior to inductionOxygen Delivery Method: Circle system utilized Preoxygenation: Pre-oxygenation with 100% oxygen Intubation Type: IV induction Ventilation: Mask ventilation without difficulty LMA: LMA inserted LMA Size: 5.0 Number of attempts: 1 Tube secured with: Tape Dental Injury: Teeth and Oropharynx as per pre-operative assessment

## 2015-08-25 NOTE — Progress Notes (Deleted)
Physical medicine rehabilitation consult requested chart reviewed. Patient with noted infected left knee with synovectomy 08/25/2015. Overweight physical occupational therapy evaluations and follow-up with rehabilitation consult accordingly

## 2015-08-25 NOTE — Progress Notes (Signed)
After 2mg  IV Dilaudid pt says pain is 8/10. Dr Lacretia NicksWSampson Goon. Fitzgerald updated-new order for additional IV Dilaudid 1mg  and po OXY IR. Will cont to monitor closely.

## 2015-08-25 NOTE — Progress Notes (Signed)
Medical consultation follow-up progress note        PATIENT DETAILS Name: Raymond Peterson Age: 44 y.o. Sex: male Date of Birth: 02-26-1971 Admit Date: 08/24/2015 Admitting Physician Francena Hanly, MD PCP:Pcp Not In System   Brief narrative: 44 year old male with recent history of left shoulder and left knee septic arthritis on IV Ancef, subsequent recent diagnosis of left leg DVT admitted by orthopedic service for concerns of ongoing/recurrent left knee septic arthritis. Hospitalist service consulted to manage medical issues.   Subjective: Some pain in the left knee area.  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 Since recurrent-Will discuss with infectious disease regarding either extending IV antibiotic treatment or changing antibiotic. Orthopedic planning on repeat left knee irrigation and debridement.  Left leg DVT: Continue heparin-currently on hold for anticipated surgery today-resume at the discretion of orthopedics.  Elevated LFTs: Abdomen is benign, hep C serology negative, hepatitis B serology pending. Suspect stable for outpatient workup.  Disposition: Remain inpatient  Antimicrobial agents  See below  Anti-infectives    Start     Dose/Rate Route Frequency Ordered Stop   08/25/15 0945  ceFAZolin (ANCEF) IVPB 2 g/50 mL premix     2 g 100 mL/hr over 30 Minutes Intravenous On call to O.R. 08/24/15 1207 08/25/15 1015   08/24/15 1400  [MAR Hold]  ceFAZolin (ANCEF) IVPB 2 g/50 mL premix     (MAR Hold since 08/25/15 0859)   2 g 100 mL/hr over 30 Minutes Intravenous 3 times per day 08/24/15 1133        DVT Prophylaxis: IV Heparin-resume at the discretion of surgery  Code Status: Full code   Family Communication Parents at bedside  Procedures: None  Time spent 40 minutes-Greater  than 50% of this time was spent in counseling, explanation of diagnosis, planning of further management, and coordination of care.  MEDICATIONS: Scheduled Meds: . [MAR Hold]  ceFAZolin (ANCEF) IV  2 g Intravenous 3 times per day  . docusate sodium  100 mg Oral BID   Continuous Infusions: . lactated ringers 75 mL/hr at 08/25/15 0129  . lactated ringers 10 mL/hr at 08/25/15 0905   PRN Meds:.acetaminophen **OR** acetaminophen, bisacodyl, HYDROmorphone (DILAUDID) injection, oxyCODONE, polyethylene glycol, promethazine, [MAR Hold] sodium chloride, sodium phosphate    PHYSICAL EXAM: Vital signs in last 24 hours: Filed Vitals:   08/24/15 1700 08/24/15 2227 08/25/15 0522 08/25/15 0849  BP:  123/76 126/72 128/81  Pulse:  96 88 88  Temp:  98.2 F (36.8 C) 98.2 F (36.8 C) 98.7 F (37.1 C)  TempSrc:  Oral Oral Oral  Resp:  Height:  (1.803 m)     Weight: 115.7 kg (255 lb 1.2 oz)     SpO2:  96% 97% 97%    Weight change:  Filed Weights   08/24/15 1700  Weight: 115.7 kg (255 lb 1.2 oz)   Body mass index is 35.59 kg/(m^2).   Gen Exam: Awake and alert with clear speech.   Neck: Supple, No JVD.   Chest: B/L Clear.   CVS: S1 S2 Regular, no murmurs.  Abdomen: soft, BS +, non tender, non distended.  Extremities: Left knee appears significantly swollen and tender-left lower extremity with swelling and edema. Neurologic: Non Focal.   Skin: No Rash.   Wounds: N/A.  Intake/Output from previous day:  Intake/Output Summary (Last 24 hours) at 08/25/15 1059 Last data filed at 08/25/15 1056  Gross per 24 hour  Intake 2387.65 ml  Output    750 ml  Net 1637.65 ml     LAB RESULTS: CBC  Recent Labs Lab 08/19/15 1348 08/20/15 0215 08/24/15 1321  WBC 10.9* 8.9 10.3  HGB 13.0 11.6* 12.6*  HCT 39.6 35.2* 38.9*  PLT 410* 378 471*  MCV 92.3 92.9 92.4  MCH 30.3 30.6 29.9  MCHC 32.8 33.0 32.4  RDW 13.1 13.1 13.0  LYMPHSABS 0.9  --  0.9  MONOABS 0.7  --  0.6    EOSABS 0.1  --  0.0  BASOSABS 0.0  --  0.0    Chemistries   Recent Labs Lab 08/19/15 1348 08/20/15 0215 08/24/15 1321  NA 135 136 135  K 4.1 4.2 4.1  CL 102 100* 100*  CO2 GLUCOSE 197* 145* 161*  BUN CREATININE 0.74 0.73 0.78  CALCIUM 8.7* 8.3* 8.7*    CBG: No results for input(s): GLUCAP in the last 168 hours.  GFR Estimated Creatinine Clearance: 152.5 mL/min (by C-G formula based on Cr of 0.78).  Coagulation profile No results for input(s): INR, PROTIME in the last 168 hours.  Cardiac Enzymes No results for input(s): CKMB, TROPONINI, MYOGLOBIN in the last 168 hours.  Invalid input(s): CK  Invalid input(s): POCBNP No results for input(s): DDIMER in the last 72 hours. No results for input(s): HGBA1C in the last 72 hours. No results for input(s): CHOL, HDL, LDLCALC, TRIG, CHOLHDL, LDLDIRECT in the last 72 hours. No results for input(s): TSH, T4TOTAL, T3FREE, THYROIDAB in the last 72 hours.  Invalid input(s): FREET3 No results for input(s): VITAMINB12, FOLATE, FERRITIN, TIBC, IRON, RETICCTPCT in the last 72 hours. No results for input(s): LIPASE, AMYLASE in the last 72 hours.  Urine Studies No results for input(s): UHGB, CRYS in the last 72 hours.  Invalid input(s): UACOL, UAPR, USPG, UPH, UTP, UGL, UKET, UBIL, UNIT, UROB, ULEU, UEPI, UWBC, URBC, UBAC, CAST, UCOM, BILUA  MICROBIOLOGY: Recent Results (from the past 240 hour(s))  Culture, blood (routine x 2)     Status: None   Collection Time: 08/19/15  1:48 PM  Result Value Ref Range Status   Specimen Description BLOOD LEFT HAND  Final   Special Requests BOTTLES DRAWN AEROBIC AND ANAEROBIC 5CC  Final   Culture NO GROWTH 5 DAYS  Final   Report Status 08/24/2015 FINAL  Final  Culture, blood (routine x 2)     Status: None   Collection Time: 08/19/15  3:08 PM  Result Value Ref Range Status   Specimen Description BLOOD LEFT HAND  Final   Special Requests IN PEDIATRIC BOTTLE 1CC  Final    Culture NO GROWTH 5 DAYS  Final   Report Status 08/24/2015 FINAL  Final    RADIOLOGY STUDIES/RESULTS: Ct Angio Chest Pe W/cm &/or Wo Cm  08/19/2015  CLINICAL DATA:  Status post left knee and left shoulder surgery, with worsening pain, swelling and fever at the left leg. Diagnosed with left leg DVT. Assess for pulmonary embolus. Initial encounter. EXAM: CT ANGIOGRAPHY CHEST WITH CONTRAST TECHNIQUE: Multidetector CT imaging of the chest was performed using the standard protocol during bolus administration of intravenous contrast. Multiplanar CT image reconstructions and MIPs were obtained to evaluate the vascular anatomy. CONTRAST:  OMNIPAQUE IOHEXOL 350 MG/ML SOLN COMPARISON:  Chest radiograph performed 08/12/2015 FINDINGS: There is no  evidence of central pulmonary embolus. However, there are a number of potential vague filling defects in the periphery of the pulmonary arteries bilaterally. This may simply reflect motion artifact or beam hardening artifact, though segmental pulmonary embolus cannot be excluded on the basis of this study. Minimal left-sided atelectasis is noted. The lungs are otherwise clear. There is no evidence of significant focal consolidation, pleural effusion or pneumothorax. No masses are identified; no abnormal focal contrast enhancement is seen. The mediastinum is unremarkable in appearance. No mediastinal lymphadenopathy is seen. No pericardial effusion is identified. The great vessels are grossly unremarkable in appearance. No axillary lymphadenopathy is seen. The visualized portions of the thyroid gland are unremarkable in appearance. The visualized portions of the liver and spleen are unremarkable. No acute osseous abnormalities are seen. Review of the MIP images confirms the above findings. IMPRESSION: 1. No evidence of central pulmonary embolus. However, there are number of potential vague filling defects in the periphery of the pulmonary arteries bilaterally; these may  simply be artifactual, though segmental pulmonary embolus cannot be excluded on the basis of this study. Depending on the patient's symptoms, a repeat CTA of the chest could be considered. Note that a new peripheral IV catheter would be required; the patient's existing power PICC limited the rate of injection of contrast on this study, corresponding to the suboptimal results. 2. Minimal left-sided atelectasis noted.  Lungs otherwise clear. Electronically Signed   By: Roanna RaiderJeffery  Chang M.D.   On: 08/19/2015 18:33   Dg Chest Port 1 View  08/12/2015  CLINICAL DATA:  Right PICC line insertion, left knee infection, access for long-term antibiotics EXAM: PORTABLE CHEST 1 VIEW COMPARISON:  None. FINDINGS: Right PICC line mid SVC level. Mild cardiac enlargement and prominent hilar vascularity. Minimal basilar atelectasis. No focal pneumonia, collapse or consolidation. No edema, effusion or pneumothorax. Trachea midline. No acute osseous finding. Monitor leads overlie the chest. IMPRESSION: Right PICC line tip mid SVC level. Cardiomegaly without CHF Basilar atelectasis Electronically Signed   By: Judie PetitM.  Shick M.D.   On: 08/12/2015 08:56    Jeoffrey MassedGHIMIRE,SHANKER, MD  [facility] Pager:[phone]  If 7PM-7AM, please contact night-coverage [email or web address] Password [email or web address] 08/25/2015, 10:59 AM   LOS: 1 day

## 2015-08-25 NOTE — Op Note (Signed)
NAMEANTONI, Raymond Peterson NO.:  [id number]  MEDICAL RECORD NO.:  [id number]  LOCATION:  [id number]                        FACILITY:  [facility]  PHYSICIAN:  Vania Rea. Eilene Voigt, M.D.  DATE OF BIRTH:  [birth year]  DATE OF PROCEDURE:  08/25/2015 DATE OF DISCHARGE:                              OPERATIVE REPORT   PREOPERATIVE DIAGNOSES: 1. Septic arthritis, left knee. 2. Left knee advanced osteoarthritis.  POSTOPERATIVE DIAGNOSES: 1. Septic arthritis, left knee. 2. Left knee advanced osteoarthritis.  PROCEDURES: 1. Left knee arthroscopy. 2. Arthroscopic synovectomy. 3. Chondroplasty in all 3 compartments. 4. Arthroscopic lavage.  SURGEON:  Vania Rea. Neziah Braley, M.D.  Threasa HeadsFrench Ana A. Shuford, PA-C.  ANESTHESIA:  General endotracheal.  ESTIMATED BLOOD LOSS:  Minimal.  TOURNIQUET:  None.  DRAINS:  Hemovac x1.  SPECIMENS:  No specimens.  HISTORY:  Raymond Peterson is a 44 year old gentleman, who has unfortunately developed spontaneous septic arthritis of his left knee and left shoulder, status post previous arthroscopic lavage and debridement of both joints, presented to the office yesterday for followup with recurrent severe left knee pain and swelling with aspiration ________ showing abundant purulent material.  He is re-admitted and brought to the operating room at this time for a repeat arthroscopic lavage debridement and synovectomy of the left knee.  Preoperatively, I had counseled Raymond Peterson regarding treatment options, potential risks versus benefits thereof.  Possible surgical complications were again reviewed, including persistent infection, anesthetic complication, and possible need for additional surgery.  He understands and accepts and agrees with our planned procedure.  PROCEDURE IN DETAIL:  After undergoing routine preop evaluation, the patient was continued on his current course of IV antibiotics, brought to the operating room, placed supine on the  operating table, underwent smooth induction of a LMA general anesthesia.  The left thigh was placed in a new leg holder and sterilely prepped and draped in standard fashion.  Time-out was called.  Standard arthroscopy portal was established in the left knee and diagnostic arthroscopy was performed. On initial entrance of the joint, a large amount of bloody and purulent synovial fluid was obtained.  We then performed diagnostic arthroscopy and utilized the shaver to perform an extensive synovectomy in all aspects of the joint.  There was noted to be large flaps of cartilage peeling off the trochlear groove as well as over the medial and lateral femoral condyles and we performed an extensive and aggressive chondroplasty in all 3 joint compartments.  Debrided the gutters and debrided the posteromedial and portion lateral aspects of the knee.  The intercondylar notch showed rimming the osteophytes with marked attenuation and degeneration of the cruciate ligaments and the torn portions were debrided.  Anterior chamber synovectomy was completed as well.  Aggressive groove and thorough irrigation were used total of 9 L of fluid, irrigated through the joint.  At the completion, then fluid and instruments were removed.  We introduced a drain from a proximal medial portal, medium Hemovac.  The portals were then closed with a 2-0 nylon simple stitch.  Dry dressings were wrapped about the knee and legs were wrapped with ACE bandage.  The thigh had support stocking.  The patient was awakened, extubated, and  taken to the recovery room in stable condition.  Postop plan will be for continued IV antibiotics per ID recommendations. He did grow out methicillin-sensitive Staph aureus.  Currently on Ancef 2 g q.8 hours and we will continue with this regimen.  We will have him use a CPM postoperatively 0-45 degrees.  We will have current inpatient rehab consult versus skilled nursing facility.  This need to  be mobilized aggressively.  Range of motion of left shoulder and left knee weightbearing as tolerated.     Vania ReaKevin M. Hillery Bhalla, M.D.     KMS/MEDQ  D:  08/25/2015  T:  08/25/2015  Job:  [id number]

## 2015-08-25 NOTE — Op Note (Addendum)
08/24/2015 - 08/25/2015  11:02 AM  PATIENT:   Raymond Peterson  44 y.o. male  PRE-OPERATIVE DIAGNOSIS:  INFECTED LEFT KNEE   POST-OPERATIVE DIAGNOSIS:  Same with underlying severe OA  PROCEDURE:  LKA, synovectomy, chondroplasty, lavage  SURGEON:  Sabastion Hrdlicka, Vania ReaKevin M. M.D.  ASSISTANTS: Shuford pac   ANESTHESIA:   LMA  EBL: min  SPECIMEN:  none  Drains: hemovac x1   PATIENT DISPOSITION:  PACU - hemodynamically stable.    PLAN OF CARE: Admit to inpatient   Dictation# [id number]   Contact # [phone] Post op plan: Will utilize CPM to help regain mobility left knee. There is severe left knee OA which has deteriorated significantly as a result of this episode of septic arthritis. Will request inpatient rehab consult vs SNF. Aggressive mobilization with PT/OT.

## 2015-08-25 NOTE — Anesthesia Postprocedure Evaluation (Signed)
  Anesthesia Post-op Note  Patient: Raymond Peterson  Procedure(s) Performed: Procedure(s): IRRIGATION AND DEBRIDEMENT LEFT KNEE  (Left)  Patient Location: PACU  Anesthesia Type:General  Level of Consciousness: awake and alert   Airway and Oxygen Therapy: Patient Spontanous Breathing  Post-op Pain: Controlled  Post-op Assessment: Post-op Vital signs reviewed, Patient's Cardiovascular Status Stable and Respiratory Function Stable  Post-op Vital Signs: Reviewed  Filed Vitals:   08/25/15 1230  BP:   Pulse: 101  Temp:   Resp: 17    Complications: No apparent anesthesia complications

## 2015-08-25 NOTE — Progress Notes (Signed)
Orthopedic Tech Progress Note Patient Details:  Raymond Peterson 06/06/1971 [id number]  CPM Left Knee CPM Left Knee: On Left Knee Flexion (Degrees): 45 Left Knee Extension (Degrees): 0 Additional Comments: Trapeze bar and foot roll   Idella Lamontagne C Eldin Bonsell 08/25/2015, 11:57 AM  

## 2015-08-25 NOTE — Progress Notes (Signed)
Physical medicine rehabilitation consult requested chart reviewed. Patient with noted infected left knee with synovectomy 08/25/2015. Await physical and occupational therapy evaluations and follow-up with appropriate recommendations at that time

## 2015-08-25 NOTE — Anesthesia Preprocedure Evaluation (Addendum)
Anesthesia Evaluation  Patient identified by MRN, date of birth, ID band Patient awake    Reviewed: Allergy & Precautions, H&P , NPO status , Patient's Chart, lab work & pertinent test results  History of Anesthesia Complications (+) PONV  Airway Mallampati: II  TM Distance: >3 FB Neck ROM: Full    Dental no notable dental hx. (+) Teeth Intact, Dental Advisory Given   Pulmonary neg pulmonary ROS,    Pulmonary exam normal breath sounds clear to auscultation       Cardiovascular + Peripheral Vascular Disease  negative cardio ROS   Rhythm:Regular Rate:Normal     Neuro/Psych negative neurological ROS  negative psych ROS   GI/Hepatic negative GI ROS, Neg liver ROS,   Endo/Other  negative endocrine ROS  Renal/GU negative Renal ROS  negative genitourinary   Musculoskeletal  (+) Arthritis , Osteoarthritis,    Abdominal   Peds  Hematology negative hematology ROS (+)   Anesthesia Other Findings   Reproductive/Obstetrics negative OB ROS                            Anesthesia Physical Anesthesia Plan  ASA: II  Anesthesia Plan: General   Post-op Pain Management:    Induction: Intravenous  Airway Management Planned: LMA  Additional Equipment:   Intra-op Plan:   Post-operative Plan: Extubation in OR  Informed Consent: I have reviewed the patients History and Physical, chart, labs and discussed the procedure including the risks, benefits and alternatives for the proposed anesthesia with the patient or authorized representative who has indicated his/her understanding and acceptance.   Dental advisory given  Plan Discussed with: CRNA  Anesthesia Plan Comments:         Anesthesia Quick Evaluation

## 2015-08-25 NOTE — Transfer of Care (Signed)
Immediate Anesthesia Transfer of Care Note  Patient: Raymond CraverJeremy D Peterson  Procedure(s) Performed: Procedure(s): IRRIGATION AND DEBRIDEMENT LEFT KNEE  (Left)  Patient Location: PACU  Anesthesia Type:General  Level of Consciousness: awake, alert , patient cooperative and responds to stimulation  Airway & Oxygen Therapy: Patient Spontanous Breathing  Post-op Assessment: Report given to RN, Post -op Vital signs reviewed and stable and Patient moving all extremities X 4  Post vital signs: Reviewed and stable  Last Vitals:  Filed Vitals:   08/25/15 1116  BP:   Pulse:   Temp: 36.7 C  Resp:     Complications: No apparent anesthesia complications

## 2015-08-25 NOTE — Progress Notes (Signed)
Orthopedic Tech Progress Note Patient Details:  Raymond CraverJeremy D Peterson 1971/02/17 [id number]  CPM Left Knee CPM Left Knee: On Left Knee Flexion (Degrees): 45 Left Knee Extension (Degrees): 0 Additional Comments: Trapeze bar and foot roll   Raymond FordyceJennifer C Kady Peterson 08/25/2015, 11:57 AM

## 2015-08-26 ENCOUNTER — Encounter (HOSPITAL_COMMUNITY)
Admission: AD | Disposition: A | Discharge status: Home Health | Payer: Self-pay | Source: Ambulatory Visit | Attending: Orthopedic Surgery

## 2015-08-26 ENCOUNTER — Encounter (HOSPITAL_COMMUNITY): Payer: Self-pay | Admitting: Orthopedic Surgery

## 2015-08-26 ENCOUNTER — Inpatient Hospital Stay (HOSPITAL_COMMUNITY): Payer: BLUE CROSS/BLUE SHIELD

## 2015-08-26 DIAGNOSIS — M00862 Arthritis due to other bacteria, left knee: Secondary | ICD-10-CM

## 2015-08-26 DIAGNOSIS — M75102 Unspecified rotator cuff tear or rupture of left shoulder, not specified as traumatic: Secondary | ICD-10-CM

## 2015-08-26 DIAGNOSIS — I288 Other diseases of pulmonary vessels: Secondary | ICD-10-CM

## 2015-08-26 HISTORY — PX: TEE WITHOUT CARDIOVERSION: SHX5443

## 2015-08-26 LAB — BASIC METABOLIC PANEL
ANION GAP: 8 (ref 5–15)
BUN: 14 mg/dL (ref 6–20)
CALCIUM: 8.5 mg/dL — AB (ref 8.9–10.3)
CO2: 29 mmol/L (ref 22–32)
CREATININE: 0.82 mg/dL (ref 0.61–1.24)
Chloride: 99 mmol/L — ABNORMAL LOW (ref 101–111)
GFR calc non Af Amer: >60 mL/min (ref >60)
Glucose, Bld: 109 mg/dL — ABNORMAL HIGH (ref 65–99)
POTASSIUM: 4.5 mmol/L (ref 3.5–5.1)
Sodium: 136 mmol/L (ref 135–145)

## 2015-08-26 LAB — CBC
HEMATOCRIT: 37.6 % — AB (ref 39.0–52.0)
Hemoglobin: 12.1 g/dL — ABNORMAL LOW (ref 13.0–17.0)
MCH: 30 pg (ref 26.0–34.0)
MCHC: 32.2 g/dL (ref 30.0–36.0)
MCV: 93.1 fL (ref 78.0–100.0)
PLATELETS: 488 10*3/uL — AB (ref 150–400)
RBC: 4.04 MIL/uL — AB (ref 4.22–5.81)
RDW: 13 % (ref 11.5–15.5)
WBC: 7.2 10*3/uL (ref 4.0–10.5)

## 2015-08-26 LAB — HEPATITIS B SURFACE ANTIBODY, QUANTITATIVE: Hepatitis B-Post: <3.1 m[IU]/mL — ABNORMAL LOW (ref >9.9)

## 2015-08-26 SURGERY — ECHOCARDIOGRAM, TRANSESOPHAGEAL
Anesthesia: Moderate Sedation

## 2015-08-26 MED ORDER — FENTANYL CITRATE (PF) 100 MCG/2ML IJ SOLN
INTRAMUSCULAR | Status: DC | PRN
  Administered 2015-08-26 (×3): 25 ug via INTRAVENOUS

## 2015-08-26 MED ORDER — POLYETHYLENE GLYCOL 3350 17 G PO PACK
17.0000 g | PACK | Freq: Every day | ORAL | Status: DC
  Administered 2015-08-26: 17 g via ORAL
  Filled 2015-08-26 (×2): qty 1

## 2015-08-26 MED ORDER — BUTAMBEN-TETRACAINE-BENZOCAINE 2-2-14 % EX AERO
INHALATION_SPRAY | CUTANEOUS | Status: DC | PRN
  Administered 2015-08-26: 2 via TOPICAL

## 2015-08-26 MED ORDER — SODIUM CHLORIDE 0.9 % IV SOLN
INTRAVENOUS | Status: DC
Start: 2015-08-26 — End: 2015-08-29
  Administered 2015-08-26 – 2015-08-29 (×2): via INTRAVENOUS

## 2015-08-26 MED ORDER — FENTANYL CITRATE (PF) 100 MCG/2ML IJ SOLN
INTRAMUSCULAR | Status: AC
  Filled 2015-08-26: qty 4

## 2015-08-26 MED ORDER — DIPHENHYDRAMINE HCL 50 MG/ML IJ SOLN
INTRAMUSCULAR | Status: AC
  Filled 2015-08-26: qty 1

## 2015-08-26 MED ORDER — MIDAZOLAM HCL 10 MG/2ML IJ SOLN
INTRAMUSCULAR | Status: DC | PRN
  Administered 2015-08-26 (×3): 2 mg via INTRAVENOUS

## 2015-08-26 MED ORDER — MIDAZOLAM HCL 5 MG/ML IJ SOLN
INTRAMUSCULAR | Status: AC
  Filled 2015-08-26: qty 3

## 2015-08-26 NOTE — Progress Notes (Addendum)
PT Cancellation Note  Patient Details Name: Raymond Peterson MRN: [id number] DOB: April 30, 1971   Cancelled Treatment:    Reason Eval/Treat Not Completed: Pain limiting ability to participate. Pt premedicated, unable to have additional medication at this time. Spent 19 min in pt room with him initially agreeable to participate with limited activity to EOB. Pt answering questions regarding PLOF and home setup however despite education, distraction, cueing for deep breathing pt unwilling to attempt to move OOB or move legs at all and finally stated he would defer evaluation today with understanding of its impact on his mobility and discharge plan.    Toney Sangabor, Mohab Ashby Beth 08/26/2015, 11:31 AM Delaney MeigsMaija Tabor Jawaan Adachi, PT [phone]

## 2015-08-26 NOTE — Discharge Instructions (Addendum)
Information on my medicine - XARELTO (rivaroxaban)  This medication education was reviewed with me or my healthcare representative as part of my discharge preparation.    WHY WAS XARELTO PRESCRIBED FOR YOU? Xarelto was prescribed to treat blood clots that may have been found in the veins of your legs (deep vein thrombosis) or in your lungs (pulmonary embolism) and to reduce the risk of them occurring again.  What do you need to know about Xarelto? The starting dose is one 15 mg tablet taken TWICE daily with food for the FIRST 21 DAYS then on 12/3  the dose is changed to one 20 mg tablet taken ONCE A DAY with your evening meal.  DO NOT stop taking Xarelto without talking to the health care provider who prescribed the medication.  Refill your prescription for 20 mg tablets before you run out.  After discharge, you should have regular check-up appointments with your healthcare provider that is prescribing your Xarelto.  In the future your dose may need to be changed if your kidney function changes by a significant amount.  What do you do if you miss a dose? If you are taking Xarelto TWICE DAILY and you miss a dose, take it as soon as you remember. You may take two 15 mg tablets (total 30 mg) at the same time then resume your regularly scheduled 15 mg twice daily the next day.  If you are taking Xarelto ONCE DAILY and you miss a dose, take it as soon as you remember on the same day then continue your regularly scheduled once daily regimen the next day. Do not take two doses of Xarelto at the same time.   Important Safety Information Xarelto is a blood thinner medicine that can cause bleeding. You should call your healthcare provider right away if you experience any of the following: ? Bleeding from an injury or your nose that does not stop. ? Unusual colored urine (red or dark brown) or unusual colored stools (red or black). ? Unusual bruising for unknown reasons. ? A serious fall or if  you hit your head (even if there is no bleeding).  Some medicines may interact with Xarelto and might increase your risk of bleeding while on Xarelto. To help avoid this, consult your healthcare provider or pharmacist prior to using any new prescription or non-prescription medications, including herbals, vitamins, non-steroidal anti-inflammatory drugs (NSAIDs) and supplements.  This website has more information on Xarelto: [email or web address].     OK to shower ROM and strengthening exercises with PT and own your own encourage mobility and time spent out of bed

## 2015-08-26 NOTE — Progress Notes (Signed)
       Medical consultation follow-up progress note        PATIENT DETAILS Name: Raymond Peterson Age: 44 y.o. Sex: male Date of Birth: 11/03/1970 Admit Date: 08/24/2015 Admitting Physician Kevin Supple, MD PCP:Pcp Not In System   Brief narrative: 44-year-old male with recent history of left shoulder and left knee septic arthritis on IV Ancef, subsequent recent diagnosis of left leg DVT admitted by orthopedic service for concerns of ongoing/recurrent left knee septic arthritis. Hospitalist service consulted to manage medical issues.   Subjective: Some pain in the left knee area.  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 TEE scheduled for later today  Left leg DVT: Continue Xarelto  Elevated LFTs: Abdomen is benign, hep B & C serology negative, Limited RUQ US-no major anormalities except for sludge-stable for further workup as outpatient.  Disposition: Remain inpatient-CIR when bed available-per primary service  Antimicrobial agents  See below  Anti-infectives    Start     Dose/Rate Route Frequency Ordered Stop   08/25/15 1400  ceFAZolin (ANCEF) IVPB 2 g/50 mL premix  Status:  Discontinued     2 g 100 mL/hr over 30 Minutes Intravenous 3 times per day 08/25/15 1308 08/25/15 1330   08/25/15 0945  ceFAZolin (ANCEF) IVPB 2 g/50 mL premix     2 g 100 mL/hr over 30 Minutes Intravenous On call to O.R. 08/24/15 1207 08/25/15 1015   08/24/15 1400  ceFAZolin (ANCEF) IVPB 2 g/50 mL premix     2 g 100 mL/hr over 30 Minutes Intravenous 3 times per day 08/24/15 1133        DVT Prophylaxis: Xarelto  Code Status: Full code   Family Communication Parents at bedside  Procedures: None  Time spent 30 minutes-Greater than 50% of this time was spent in counseling, explanation of diagnosis, planning of further management, and coordination of care.  MEDICATIONS: Scheduled Meds: .   ceFAZolin (ANCEF) IV  2 g Intravenous 3 times per day  . docusate sodium  100 mg Oral BID  . rivaroxaban  15 mg Oral BID WC   Followed by  . [START ON 09/10/2015] rivaroxaban  20 mg Oral Q supper  . zolpidem  10 mg Oral QHS   Continuous Infusions: . sodium chloride 20 mL/hr at 08/26/15 1028  . lactated ringers 75 mL/hr at 08/25/15 0129  . lactated ringers 10 mL/hr at 08/25/15 1630   PRN Meds:.acetaminophen **OR** acetaminophen, bisacodyl, diphenhydrAMINE, HYDROmorphone (DILAUDID) injection, [DISCONTINUED] methocarbamol **OR** methocarbamol (ROBAXIN)  IV, methocarbamol, [DISCONTINUED] metoCLOPramide **OR** metoCLOPramide (REGLAN) injection, ondansetron **OR** ondansetron (ZOFRAN) IV, oxyCODONE, polyethylene glycol, promethazine, sodium chloride, sodium phosphate    PHYSICAL EXAM: Vital signs in last 24 hours: Filed Vitals:   08/25/15 1937 08/26/15 0008 08/26/15 0522 08/26/15 1228  BP: 133/82 135/72 123/79 118/76  Pulse: 101 107 98 102  Temp: 98.8 F (37.1 C) 98.6 F (37 C) 99.5 F (37.5 C) 99.7 F (37.6 C)  TempSrc:   Oral   Resp: 16 17 16 16  Height:      Weight:      SpO2: 95% 95% 96% 96%    Weight change:  Filed Weights   08/24/15 1700  Weight: 115.7 kg (255 lb 1.2 oz)   Body mass index is 35.59 kg/(m^2).   Gen Exam: Awake and alert with clear speech.   Neck: Supple, No JVD.   Chest: B/L Clear.   CVS: S1   S2 Regular, no murmurs.  Abdomen: soft, BS +, non tender, non distended.  Neurologic: Non Focal.   Skin: No Rash.   Wounds: N/A.   Intake/Output from previous day:  Intake/Output Summary (Last 24 hours) at 08/26/15 1306 Last data filed at 08/26/15 1100  Gross per 24 hour  Intake    230 ml  Output   1675 ml  Net  -1445 ml     LAB RESULTS: CBC  Recent Labs Lab 08/19/15 1348 08/20/15 0215 08/24/15 1321 08/26/15 0530  WBC 10.9* 8.9 10.3 7.2  HGB 13.0 11.6* 12.6* 12.1*  HCT 39.6 35.2* 38.9* 37.6*  PLT 410* 378 471* 488*  MCV 92.3 92.9 92.4 93.1    MCH 30.3 30.6 29.9 30.0  MCHC 32.8 33.0 32.4 32.2  RDW 13.1 13.1 13.0 13.0  LYMPHSABS 0.9  --  0.9  --   MONOABS 0.7  --  0.6  --   EOSABS 0.1  --  0.0  --   BASOSABS 0.0  --  0.0  --     Chemistries   Recent Labs Lab 08/19/15 1348 08/20/15 0215 08/24/15 1321 08/26/15 0530  NA 135 136 135 136  K 4.1 4.2 4.1 4.5  CL 102 100* 100* 99*  CO2 GLUCOSE 197* 145* 161* 109*  BUN CREATININE 0.74 0.73 0.78 0.82  CALCIUM 8.7* 8.3* 8.7* 8.5*    CBG: No results for input(s): GLUCAP in the last 168 hours.  GFR Estimated Creatinine Clearance: 148.8 mL/min (by C-G formula based on Cr of 0.82).  Coagulation profile No results for input(s): INR, PROTIME in the last 168 hours.  Cardiac Enzymes No results for input(s): CKMB, TROPONINI, MYOGLOBIN in the last 168 hours.  Invalid input(s): CK  Invalid input(s): POCBNP No results for input(s): DDIMER in the last 72 hours. No results for input(s): HGBA1C in the last 72 hours. No results for input(s): CHOL, HDL, LDLCALC, TRIG, CHOLHDL, LDLDIRECT in the last 72 hours. No results for input(s): TSH, T4TOTAL, T3FREE, THYROIDAB in the last 72 hours.  Invalid input(s): FREET3 No results for input(s): VITAMINB12, FOLATE, FERRITIN, TIBC, IRON, RETICCTPCT in the last 72 hours. No results for input(s): LIPASE, AMYLASE in the last 72 hours.  Urine Studies No results for input(s): UHGB, CRYS in the last 72 hours.  Invalid input(s): UACOL, UAPR, USPG, UPH, UTP, UGL, UKET, UBIL, UNIT, UROB, ULEU, UEPI, UWBC, URBC, UBAC, CAST, UCOM, BILUA  MICROBIOLOGY: Recent Results (from the past 240 hour(s))  Culture, blood (routine x 2)     Status: None   Collection Time: 08/19/15  1:48 PM  Result Value Ref Range Status   Specimen Description BLOOD LEFT HAND  Final   Special Requests BOTTLES DRAWN AEROBIC AND ANAEROBIC 5CC  Final   Culture NO GROWTH 5 DAYS  Final   Report Status 08/24/2015 FINAL  Final  Culture, blood (routine  x 2)     Status: None   Collection Time: 08/19/15  3:08 PM  Result Value Ref Range Status   Specimen Description BLOOD LEFT HAND  Final   Special Requests IN PEDIATRIC BOTTLE 1CC  Final   Culture NO GROWTH 5 DAYS  Final   Report Status 08/24/2015 FINAL  Final    RADIOLOGY STUDIES/RESULTS: Ct Angio Chest Pe W/cm &/or Wo Cm  08/19/2015  CLINICAL DATA:  Status post left knee and left shoulder surgery, with worsening pain, swelling and fever at the left leg. Diagnosed with left leg  DVT. Assess for pulmonary embolus. Initial encounter. EXAM: CT ANGIOGRAPHY CHEST WITH CONTRAST TECHNIQUE: Multidetector CT imaging of the chest was performed using the standard protocol during bolus administration of intravenous contrast. Multiplanar CT image reconstructions and MIPs were obtained to evaluate the vascular anatomy. CONTRAST:  100mL OMNIPAQUE IOHEXOL 350 MG/ML SOLN COMPARISON:  Chest radiograph performed 08/12/2015 FINDINGS: There is no evidence of central pulmonary embolus. However, there are a number of potential vague filling defects in the periphery of the pulmonary arteries bilaterally. This may simply reflect motion artifact or beam hardening artifact, though segmental pulmonary embolus cannot be excluded on the basis of this study. Minimal left-sided atelectasis is noted. The lungs are otherwise clear. There is no evidence of significant focal consolidation, pleural effusion or pneumothorax. No masses are identified; no abnormal focal contrast enhancement is seen. The mediastinum is unremarkable in appearance. No mediastinal lymphadenopathy is seen. No pericardial effusion is identified. The great vessels are grossly unremarkable in appearance. No axillary lymphadenopathy is seen. The visualized portions of the thyroid gland are unremarkable in appearance. The visualized portions of the liver and spleen are unremarkable. No acute osseous abnormalities are seen. Review of the MIP images confirms the above  findings. IMPRESSION: 1. No evidence of central pulmonary embolus. However, there are number of potential vague filling defects in the periphery of the pulmonary arteries bilaterally; these may simply be artifactual, though segmental pulmonary embolus cannot be excluded on the basis of this study. Depending on the patient's symptoms, a repeat CTA of the chest could be considered. Note that a new peripheral IV catheter would be required; the patient's existing power PICC limited the rate of injection of contrast on this study, corresponding to the suboptimal results. 2. Minimal left-sided atelectasis noted.  Lungs otherwise clear. Electronically Signed   By: Roanna RaiderJeffery  Chang M.D.   On: 08/19/2015 18:33   Dg Chest Port 1 View  08/12/2015  CLINICAL DATA:  Right PICC line insertion, left knee infection, access for long-term antibiotics EXAM: PORTABLE CHEST 1 VIEW COMPARISON:  None. FINDINGS: Right PICC line mid SVC level. Mild cardiac enlargement and prominent hilar vascularity. Minimal basilar atelectasis. No focal pneumonia, collapse or consolidation. No edema, effusion or pneumothorax. Trachea midline. No acute osseous finding. Monitor leads overlie the chest. IMPRESSION: Right PICC line tip mid SVC level. Cardiomegaly without CHF Basilar atelectasis Electronically Signed   By: Judie PetitM.  Shick M.D.   On: 08/12/2015 08:56   [place] Abdomen Limited Ruq  08/26/2015  CLINICAL DATA:  44 year old male with abnormal liver enzymes. Recent knee and shoulder surgery with fever and pain. Initial encounter. EXAM: US ABDOMEN LIMITED - RIGHT UPPER QUADRANT COMPARISON:  Chest CTA 08/19/2015 FINDINGS: Gallbladder: Subtotally filled with echogenic sludge. No shadowing echogenic stones identified. Gallbladder wall thickness remains normal. No pericholecystic fluid. No sonographic Murphy sign elicited. Common bile duct: Diameter: 4 mm, normal Liver: Echogenicity within normal limits. No discrete liver lesion or intrahepatic biliary ductal  dilatation identified. Other findings: Negative visible right kidney. IMPRESSION: 1. Gallbladder is filled with sludge. 2. No evidence of acute cholecystitis or acute biliary obstruction. Electronically Signed   By: Odessa FlemingH  Hall M.D.   On: 08/26/2015 09:51    Jeoffrey MassedGHIMIRE,SHANKER, MD  [facility] Pager:[phone]  If 7PM-7AM, please contact night-coverage [email or web address] Password [email or web address] 08/26/2015, 1:06 PM   LOS: 2 days

## 2015-08-26 NOTE — Progress Notes (Signed)
    [facility] has been requested to perform a transesophageal echocardiogram on 08/26/15 for bacteremia.  After careful review of history and examination, the risks and benefits of transesophageal echocardiogram have been explained including risks of esophageal damage, perforation (1:10,000 risk), bleeding, pharyngeal hematoma as well as other potential complications associated with conscious sedation including aspiration, arrhythmia, respiratory failure and death. Alternatives to treatment were discussed, questions were answered. Patient is willing to proceed.   Janetta HoraHOMPSON, Elfa Wooton R, PA-C 08/26/2015 8:25 AM

## 2015-08-26 NOTE — Progress Notes (Signed)
  Echocardiogram Echocardiogram Transesophageal has been performed.  Janalyn HarderWest, Brailey Buescher R 08/26/2015, 3:29 PM

## 2015-08-26 NOTE — Progress Notes (Signed)
[facility] for Infectious Disease    Date of Admission:  08/24/2015   Total days of antibiotics 2 /15        Day 2 cefazolin           ID: Raymond Peterson is a 44 y.o. male with MSSA left knee septic arthritis s/p I x D on 11/16. Also being treated for left shoulder septic arthritis  Active Problems:   Septic joint of left knee joint (HCC)   Venous thromboembolism (VTE)   Abnormal LFTs   DVT of leg (deep venous thrombosis) (HCC)    Subjective: Remains afebrile. underoing TEE today  Medications:  .  ceFAZolin (ANCEF) IV  2 g Intravenous 3 times per day  . docusate sodium  100 mg Oral BID  . rivaroxaban  15 mg Oral BID WC   Followed by  . [START ON 09/10/2015] rivaroxaban  20 mg Oral Q supper  . zolpidem  10 mg Oral QHS    Objective: Vital signs in last 24 hours: Temp:  [98.3 F (36.8 C)-99.7 F (37.6 C)] 99.7 F (37.6 C) (11/18 1228) Pulse Rate:  [98-107] 102 (11/18 1228) Resp:  [16-17] 16 (11/18 1228) BP: (118-135)/(72-82) 118/76 mmHg (11/18 1228) SpO2:  [95 %-96 %] 96 % (11/18 1228)  Physical Exam  Constitutional: He is oriented to person, place, and time. He appears well-developed and well-nourished. No distress.  HENT:  Mouth/Throat: Oropharynx is clear and moist. No oropharyngeal exudate.  Cardiovascular: Normal rate, regular rhythm and normal heart sounds. Exam reveals no gallop and no friction rub.  No murmur heard.  Pulmonary/Chest: Effort normal and breath sounds normal. No respiratory distress. He has no wheezes.  Abdominal: Soft. Bowel sounds are normal. He exhibits no distension. There is no tenderness.  Lymphadenopathy:  He has no cervical adenopathy.  Ext: left knee is wrapped, with accordion drain in place Skin: Skin is warm and dry. No rash noted. No erythema.  Psychiatric: He has a normal mood and affect. His behavior is normal.     Lab Results  Recent Labs  08/24/15 1321 08/26/15 0530  WBC 10.3 7.2  HGB 12.6* 12.1*  HCT 38.9*  37.6*  NA 135 136  K 4.1 4.5  CL 100* 99*  CO2 25 29  BUN 14 14  CREATININE 0.78 0.82   Liver Panel  Recent Labs  08/24/15 1321  PROT 7.5  ALBUMIN 2.1*  AST 53*  ALT 44  ALKPHOS 289*  BILITOT 0.8    Microbiology: No tissue cx sent from or Studies/Results: US Abdomen Limited Ruq  08/26/2015  CLINICAL DATA:  44 year old male with abnormal liver enzymes. Recent knee and shoulder surgery with fever and pain. Initial encounter. EXAM: US ABDOMEN LIMITED - RIGHT UPPER QUADRANT COMPARISON:  Chest CTA 08/19/2015 FINDINGS: Gallbladder: Subtotally filled with echogenic sludge. No shadowing echogenic stones identified. Gallbladder wall thickness remains normal. No pericholecystic fluid. No sonographic Murphy sign elicited. Common bile duct: Diameter: 4 mm, normal Liver: Echogenicity within normal limits. No discrete liver lesion or intrahepatic biliary ductal dilatation identified. Other findings: Negative visible right kidney. IMPRESSION: 1. Gallbladder is filled with sludge. 2. No evidence of acute cholecystitis or acute biliary obstruction. Electronically Signed   By: Genevie Ann M.D.   On: 08/26/2015 09:51     Assessment/Plan: mssa septic arthritis of left knee and shoulder = will restart abtx clock using 11/17 as day 1 of 28. Continue on cefazolin 2gm IV Q 8hr. Undergoing TEE today. If vegetation  seen, recommend CT surgery evaluation. If no signs of vegetations, will continue with 4 wk of cefazolin. We will see back in the ID clinic in 4 wk to decide if need to extend further.  Will check sed rate adn crp  Dvt= continue with anticoagulation  Elevated alk phos = RUQ does not suggest having cholecystitis. Continue to monitor  dispo to go to CIR on 4th floor for rehab  Baxter Flattery [facility] Cell: [phone] Pager: [phone]  08/26/2015, 12:56 PM

## 2015-08-26 NOTE — Progress Notes (Addendum)
I await PT and OT evals to be able to initiate [employer] insurance approval for a possible inpt rehab admission. Rehab venue will be dependent on insurance approval and bed availability next week once pt begins participation with [phone]

## 2015-08-26 NOTE — Interval H&P Note (Signed)
History and Physical Interval Note:  08/26/2015 2:25 PM  Raymond Peterson  has presented today for surgery, with the diagnosis of bacteremia  The various methods of treatment have been discussed with the patient and family. After consideration of risks, benefits and other options for treatment, the patient has consented to  Procedure(s): TRANSESOPHAGEAL ECHOCARDIOGRAM (TEE) (N/A) as a surgical intervention .  The patient's history has been reviewed, patient examined, no change in status, stable for surgery.  I have reviewed the patient's chart and labs.  Questions were answered to the patient's satisfaction.     Olga MillersBrian Crenshaw

## 2015-08-26 NOTE — CV Procedure (Signed)
See full TEE report in [employer]; normal LV function; no vegetations. Raymond Peterson  

## 2015-08-26 NOTE — H&P (View-Only) (Signed)
Medical consultation follow-up progress note        PATIENT DETAILS Name: Raymond Peterson Age: 44 y.o. Sex: male Date of Birth: 08-20-1971 Admit Date: 08/24/2015 Admitting Physician Francena Hanly, MD PCP:Pcp Not In System   Brief narrative: 44 year old male with recent history of left shoulder and left knee septic arthritis on IV Ancef, subsequent recent diagnosis of left leg DVT admitted by orthopedic service for concerns of ongoing/recurrent left knee septic arthritis. Hospitalist service consulted to manage medical issues.   Subjective: Some pain in the left knee area.  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 TEE scheduled for later today  Left leg DVT: Continue Xarelto  Elevated LFTs: Abdomen is benign, hep B & C serology negative, Limited RUQ US-no major anormalities except for sludge-stable for further workup as outpatient.  Disposition: Remain inpatient-CIR when bed available-per primary service  Antimicrobial agents  See below  Anti-infectives    Start     Dose/Rate Route Frequency Ordered Stop   08/25/15 1400  ceFAZolin (ANCEF) IVPB 2 g/50 mL premix  Status:  Discontinued     2 g 100 mL/hr over 30 Minutes Intravenous 3 times per day 08/25/15 1308 08/25/15 1330   08/25/15 0945  ceFAZolin (ANCEF) IVPB 2 g/50 mL premix     2 g 100 mL/hr over 30 Minutes Intravenous On call to O.R. 08/24/15 1207 08/25/15 1015   08/24/15 1400  ceFAZolin (ANCEF) IVPB 2 g/50 mL premix     2 g 100 mL/hr over 30 Minutes Intravenous 3 times per day 08/24/15 1133        DVT Prophylaxis: Xarelto  Code Status: Full code   Family Communication Parents at bedside  Procedures: None  Time spent 30 minutes-Greater than 50% of this time was spent in counseling, explanation of diagnosis, planning of further management, and coordination of care.  MEDICATIONS: Scheduled Meds: .   ceFAZolin (ANCEF) IV  2 g Intravenous 3 times per day  . docusate sodium  100 mg Oral BID  . rivaroxaban  15 mg Oral BID WC   Followed by  . [START ON 09/10/2015] rivaroxaban  20 mg Oral Q supper  . zolpidem  10 mg Oral QHS   Continuous Infusions: . sodium chloride 20 mL/hr at 08/26/15 1028  . lactated ringers 75 mL/hr at 08/25/15 0129  . lactated ringers 10 mL/hr at 08/25/15 1630   PRN Meds:.acetaminophen **OR** acetaminophen, bisacodyl, diphenhydrAMINE, HYDROmorphone (DILAUDID) injection, [DISCONTINUED] methocarbamol **OR** methocarbamol (ROBAXIN)  IV, methocarbamol, [DISCONTINUED] metoCLOPramide **OR** metoCLOPramide (REGLAN) injection, ondansetron **OR** ondansetron (ZOFRAN) IV, oxyCODONE, polyethylene glycol, promethazine, sodium chloride, sodium phosphate    PHYSICAL EXAM: Vital signs in last 24 hours: Filed Vitals:   08/25/15 1937 08/26/15 0008 08/26/15 0522 08/26/15 1228  BP: 133/82 135/72 123/79 118/76  Pulse: 101 107 98 102  Temp: 98.8 F (37.1 C) 98.6 F (37 C) 99.5 F (37.5 C) 99.7 F (37.6 C)  TempSrc:   Oral   Resp: Height:      Weight:      SpO2: 95% 95% 96% 96%    Weight change:  Filed Weights   08/24/15 1700  Weight: 115.7 kg (255 lb 1.2 oz)   Body mass index is 35.59 kg/(m^2).   Gen Exam: Awake and alert with clear speech.   Neck: Supple, No JVD.   Chest: B/L Clear.   CVS: S1  S2 Regular, no murmurs.  Abdomen: soft, BS +, non tender, non distended.  Neurologic: Non Focal.   Skin: No Rash.   Wounds: N/A.   Intake/Output from previous day:  Intake/Output Summary (Last 24 hours) at 08/26/15 1306 Last data filed at 08/26/15 1100  Gross per 24 hour  Intake    230 ml  Output   1675 ml  Net  -1445 ml     LAB RESULTS: CBC  Recent Labs Lab 08/19/15 1348 08/20/15 0215 08/24/15 1321 08/26/15 0530  WBC 10.9* 8.9 10.3 7.2  HGB 13.0 11.6* 12.6* 12.1*  HCT 39.6 35.2* 38.9* 37.6*  PLT 410* 378 471* 488*  MCV 92.3 92.9 92.4 93.1    MCH 30.3 30.6 29.9 30.0  MCHC 32.8 33.0 32.4 32.2  RDW 13.1 13.1 13.0 13.0  LYMPHSABS 0.9  --  0.9  --   MONOABS 0.7  --  0.6  --   EOSABS 0.1  --  0.0  --   BASOSABS 0.0  --  0.0  --     Chemistries   Recent Labs Lab 08/19/15 1348 08/20/15 0215 08/24/15 1321 08/26/15 0530  NA 135 136 135 136  K 4.1 4.2 4.1 4.5  CL 102 100* 100* 99*  CO2 GLUCOSE 197* 145* 161* 109*  BUN CREATININE 0.74 0.73 0.78 0.82  CALCIUM 8.7* 8.3* 8.7* 8.5*    CBG: No results for input(s): GLUCAP in the last 168 hours.  GFR Estimated Creatinine Clearance: 148.8 mL/min (by C-G formula based on Cr of 0.82).  Coagulation profile No results for input(s): INR, PROTIME in the last 168 hours.  Cardiac Enzymes No results for input(s): CKMB, TROPONINI, MYOGLOBIN in the last 168 hours.  Invalid input(s): CK  Invalid input(s): POCBNP No results for input(s): DDIMER in the last 72 hours. No results for input(s): HGBA1C in the last 72 hours. No results for input(s): CHOL, HDL, LDLCALC, TRIG, CHOLHDL, LDLDIRECT in the last 72 hours. No results for input(s): TSH, T4TOTAL, T3FREE, THYROIDAB in the last 72 hours.  Invalid input(s): FREET3 No results for input(s): VITAMINB12, FOLATE, FERRITIN, TIBC, IRON, RETICCTPCT in the last 72 hours. No results for input(s): LIPASE, AMYLASE in the last 72 hours.  Urine Studies No results for input(s): UHGB, CRYS in the last 72 hours.  Invalid input(s): UACOL, UAPR, USPG, UPH, UTP, UGL, UKET, UBIL, UNIT, UROB, ULEU, UEPI, UWBC, URBC, UBAC, CAST, UCOM, BILUA  MICROBIOLOGY: Recent Results (from the past 240 hour(s))  Culture, blood (routine x 2)     Status: None   Collection Time: 08/19/15  1:48 PM  Result Value Ref Range Status   Specimen Description BLOOD LEFT HAND  Final   Special Requests BOTTLES DRAWN AEROBIC AND ANAEROBIC 5CC  Final   Culture NO GROWTH 5 DAYS  Final   Report Status 08/24/2015 FINAL  Final  Culture, blood (routine  x 2)     Status: None   Collection Time: 08/19/15  3:08 PM  Result Value Ref Range Status   Specimen Description BLOOD LEFT HAND  Final   Special Requests IN PEDIATRIC BOTTLE 1CC  Final   Culture NO GROWTH 5 DAYS  Final   Report Status 08/24/2015 FINAL  Final    RADIOLOGY STUDIES/RESULTS: Ct Angio Chest Pe W/cm &/or Wo Cm  08/19/2015  CLINICAL DATA:  Status post left knee and left shoulder surgery, with worsening pain, swelling and fever at the left leg. Diagnosed with left leg  DVT. Assess for pulmonary embolus. Initial encounter. EXAM: CT ANGIOGRAPHY CHEST WITH CONTRAST TECHNIQUE: Multidetector CT imaging of the chest was performed using the standard protocol during bolus administration of intravenous contrast. Multiplanar CT image reconstructions and MIPs were obtained to evaluate the vascular anatomy. CONTRAST:  100mL OMNIPAQUE IOHEXOL 350 MG/ML SOLN COMPARISON:  Chest radiograph performed 08/12/2015 FINDINGS: There is no evidence of central pulmonary embolus. However, there are a number of potential vague filling defects in the periphery of the pulmonary arteries bilaterally. This may simply reflect motion artifact or beam hardening artifact, though segmental pulmonary embolus cannot be excluded on the basis of this study. Minimal left-sided atelectasis is noted. The lungs are otherwise clear. There is no evidence of significant focal consolidation, pleural effusion or pneumothorax. No masses are identified; no abnormal focal contrast enhancement is seen. The mediastinum is unremarkable in appearance. No mediastinal lymphadenopathy is seen. No pericardial effusion is identified. The great vessels are grossly unremarkable in appearance. No axillary lymphadenopathy is seen. The visualized portions of the thyroid gland are unremarkable in appearance. The visualized portions of the liver and spleen are unremarkable. No acute osseous abnormalities are seen. Review of the MIP images confirms the above  findings. IMPRESSION: 1. No evidence of central pulmonary embolus. However, there are number of potential vague filling defects in the periphery of the pulmonary arteries bilaterally; these may simply be artifactual, though segmental pulmonary embolus cannot be excluded on the basis of this study. Depending on the patient's symptoms, a repeat CTA of the chest could be considered. Note that a new peripheral IV catheter would be required; the patient's existing power PICC limited the rate of injection of contrast on this study, corresponding to the suboptimal results. 2. Minimal left-sided atelectasis noted.  Lungs otherwise clear. Electronically Signed   By: Roanna RaiderJeffery  Chang M.D.   On: 08/19/2015 18:33   Dg Chest Port 1 View  08/12/2015  CLINICAL DATA:  Right PICC line insertion, left knee infection, access for long-term antibiotics EXAM: PORTABLE CHEST 1 VIEW COMPARISON:  None. FINDINGS: Right PICC line mid SVC level. Mild cardiac enlargement and prominent hilar vascularity. Minimal basilar atelectasis. No focal pneumonia, collapse or consolidation. No edema, effusion or pneumothorax. Trachea midline. No acute osseous finding. Monitor leads overlie the chest. IMPRESSION: Right PICC line tip mid SVC level. Cardiomegaly without CHF Basilar atelectasis Electronically Signed   By: Judie PetitM.  Shick M.D.   On: 08/12/2015 08:56   [place] Abdomen Limited Ruq  08/26/2015  CLINICAL DATA:  44 year old male with abnormal liver enzymes. Recent knee and shoulder surgery with fever and pain. Initial encounter. EXAM: US ABDOMEN LIMITED - RIGHT UPPER QUADRANT COMPARISON:  Chest CTA 08/19/2015 FINDINGS: Gallbladder: Subtotally filled with echogenic sludge. No shadowing echogenic stones identified. Gallbladder wall thickness remains normal. No pericholecystic fluid. No sonographic Murphy sign elicited. Common bile duct: Diameter: 4 mm, normal Liver: Echogenicity within normal limits. No discrete liver lesion or intrahepatic biliary ductal  dilatation identified. Other findings: Negative visible right kidney. IMPRESSION: 1. Gallbladder is filled with sludge. 2. No evidence of acute cholecystitis or acute biliary obstruction. Electronically Signed   By: Odessa FlemingH  Hall M.D.   On: 08/26/2015 09:51    Jeoffrey MassedGHIMIRE,Donell Sliwinski, MD  [facility] Pager:[phone]  If 7PM-7AM, please contact night-coverage [email or web address] Password [email or web address] 08/26/2015, 1:06 PM   LOS: 2 days

## 2015-08-26 NOTE — Progress Notes (Signed)
Raymond CraverJeremy D Peterson  MRN: [id number] DOB/Age: 44-Aug-1972 44 y.o. Physician: Jacquelyne BalintKevin Supple,MD Procedure: Procedure(s) (LRB): IRRIGATION AND DEBRIDEMENT LEFT KNEE  (Left)     Subjective: Looks and states actually feels a little better. He did refuse PT/OT this am but they came at a bad time, he hasn't eaten in 2 days and is awaiting his TEE supposedly to be done at 3 pm. He states he is willing and ready to get working tomorrow.  Vital Signs Temp:  [98.6 F (37 C)-99.7 F (37.6 C)] 99.7 F (37.6 C) (11/18 1228) Pulse Rate:  [98-107] 102 (11/18 1228) Resp:  [16-17] 16 (11/18 1228) BP: (118-135)/(72-82) 118/76 mmHg (11/18 1228) SpO2:  [95 %-96 %] 96 % (11/18 1228)  Lab Results  Recent Labs  08/24/15 1321 08/26/15 0530  WBC 10.3 7.2  HGB 12.6* 12.1*  HCT 38.9* 37.6*  PLT 471* 488*   BMET  Recent Labs  08/24/15 1321 08/26/15 0530  NA 135 136  K 4.1 4.5  CL 100* 99*  CO2 25 29  GLUCOSE 161* 109*  BUN 14 14  CREATININE 0.78 0.82  CALCIUM 8.7* 8.5*   INR  Date Value Ref Range Status  08/11/2015 1.28 0.00 - 1.49 Final     Exam hemovac patent with bloody drainage only NVI LLE and LUE        Plan Long discussion with Raymond Peterson regarding all of current treatments and plans. Pending insurance approvals he has been deemed rehab candidate. We have discussed and he has agreed with plan for mobilization and strengthening.   hemovac out tomorrow Mobilization and Rehab admit when bed avail.  Gr[facility] Memorial HospitalHUFORD,Raymond Viverette PA-C  for Dr.Kevin Supple 08/26/2015, 1:42 PM Contact # [phone]

## 2015-08-26 NOTE — Consult Note (Signed)
Physical Medicine and Rehabilitation Consult Reason for Consult: Septic left knee joint Referring Physician: Dr. Rennis Chris   HPI: Raymond Peterson is a 44 y.o. right handed male with history of work-related left shoulder rotator cuff injury followed by Dr. Rennis Chris as well as progressive left knee pain. Lives with wife and 51-year-old. One level home. He has been staying with his father who could provide assistance. Patient had been diagnosed with  MSSA arthritis earlier in the month of both left knee and left shoulder and underwent arthroscopic lavage on 11/ 3 and 11/4. Infectious disease  consulted at that time placed on IV Ancef 4 weeks. A TEE was completed showing no vegetation. On 08/19/2015 diagnosed with DVT left posterior tibial and peroneal veins. Patient was placed on Xarelto. Patient was discharged to home with worsening and swelling of knee and left shoulder pain. Underwent arthrocentesis in the office of Dr. Rennis Chris with purulent aspiration and white blood cell count 10,300. Admitted 08/24/2015 for further evaluation. Underwent left knee arthroscopy, arthroscopic synovectomy, chronoplasty in all 3 compartments 08/25/2015. Infectious disease consulted. Latest blood culture showed no growth to date. Currently maintained on Cefazolin 2 g every 8 hours 6 weeks dated 08/16/2015. Plan follow-up repeat TEE. Hospital course pain management. Physical and occupational therapy evaluations are pending. M.D. has requested physical medicine rehabilitation consult.   Review of Systems  Constitutional: Negative for chills.       Low-grade fever  HENT: Negative for hearing loss.   Eyes: Negative for blurred vision and double vision.  Respiratory: Negative for cough and shortness of breath.   Cardiovascular: Negative for chest pain, palpitations and leg swelling.  Gastrointestinal: Positive for constipation. Negative for nausea, vomiting and abdominal pain.  Genitourinary: Negative for dysuria and  hematuria.  Musculoskeletal: Positive for myalgias and joint pain.  Skin: Negative for rash.  Neurological: Negative for dizziness, seizures and headaches.   Past Medical History  Diagnosis Date  . PONV (postoperative nausea and vomiting)     "just once"  . Arthritis     oa  kness & shoulders    Past Surgical History  Procedure Laterality Date  . Rotator cuff repair Left   . Knee arthroscopy Right   . Shoulder arthroscopy Right   . Knee arthroscopy Left 08/11/2015    Procedure: Left Knee Arthroscopic Lavage;  Surgeon: Francena Hanly, MD;  Location: [facility] OR;  Service: Orthopedics;  Laterality: Left;  . Fine needle aspiration Left 08/11/2015    Procedure: LEFT SHOULDER ASPIRATION;  Surgeon: Francena Hanly, MD;  Location: [facility] OR;  Service: Orthopedics;  Laterality: Left;  . Shoulder arthroscopy Left 08/13/2015    Procedure: Arthroscopic Washout Of Left Shoulder;  Surgeon: Francena Hanly, MD;  Location: [facility] OR;  Service: Orthopedics;  Laterality: Left;  Marland Kitchen Tympanostomy tube placement      as a child    History reviewed. No pertinent family history. Social History:  reports that he has never smoked. He has never used smokeless tobacco. He reports that he does not drink alcohol or use illicit drugs. Allergies: No Known Allergies Medications Prior to Admission  Medication Sig Dispense Refill  . ceFAZolin (ANCEF) 2-3 GM-% SOLR Inject 50 mLs (2 g total) into the vein every 8 (eight) hours. 50 mL 90  . docusate sodium (COLACE) 100 MG capsule Take 1 capsule (100 mg total) by mouth 2 (two) times daily. 10 capsule 0  . methocarbamol (ROBAXIN) 500 MG tablet Take 1 tablet (500 mg total) by mouth every  8 (eight) hours as needed for muscle spasms. 50 tablet 1  . oxyCODONE-acetaminophen (PERCOCET) 5-325 MG tablet Take 1-2 tablets by mouth every 4 (four) hours as needed. 60 tablet 0  . Rivaroxaban (XARELTO) 15 MG TABS tablet Take 1 tablet (15 mg total) by mouth 2 (two) times daily with a meal. 42 tablet 0  .  zolpidem (AMBIEN) 10 MG tablet Take 1 tablet (10 mg total) by mouth at bedtime as needed for sleep. (Patient taking differently: Take 10 mg by mouth at bedtime. ) 30 tablet 1  . polyethylene glycol (MIRALAX / GLYCOLAX) packet Take 17 g by mouth daily. 14 each 0  . [START ON 09/10/2015] rivaroxaban (XARELTO) 20 MG TABS tablet Take 1 tablet (20 mg total) by mouth daily with supper. 30 tablet 0    Home: Home Living Family/patient expects to be discharged to:: Private residence Living Arrangements: Spouse/significant other, Children  Functional History:   Functional Status:  Mobility:          ADL:    Cognition: Cognition Orientation Level: Oriented X4    Blood pressure 123/79, pulse 98, temperature 99.5 F (37.5 C), temperature source Oral, resp. rate 16, height 5\' 11"  (1.803 m), weight 115.7 kg (255 lb 1.2 oz), SpO2 96 %. Physical Exam  Constitutional: He is oriented to person, place, and time. He appears well-developed.  HENT:  Head: Normocephalic.  Eyes: EOM are normal.  Neck: Normal range of motion. Neck supple. No thyromegaly present.  Cardiovascular: Normal rate and regular rhythm.   Respiratory: Effort normal and breath sounds normal. No respiratory distress.  GI: Soft. Bowel sounds are normal. He exhibits no distension.  Neurological: He is alert and oriented to person, place, and time.  Can not lift left shoulder, left bicep/tricep/wrist 4/5 grossly. RUE 4+/5. RLE 3/5 HF,KE and 4/5 adf/apf.  LLE: can wiggle toes--otherwise no movement due to pain. No sensory deficits.   Skin: Skin is warm and dry.  Psychiatric:  Pt irritable, anxious. cooperative    Results for orders placed or performed during the hospital encounter of 08/24/15 (from the past 24 hour(s))  APTT     Status: Abnormal   Collection Time: 08/25/15  6:05 AM  Result Value Ref Range   aPTT 40 (H) 24 - 37 seconds  Heparin level (unfractionated)     Status: Abnormal   Collection Time: 08/25/15  6:08 AM    Result Value Ref Range   Heparin Unfractionated 0.28 (L) 0.30 - 0.70 IU/mL   No results found.  Assessment/Plan: Diagnosis: profound mobility deficits, pain, weakness due to septic left knee, severe left rotator cuff injury 1. Does the need for close, 24 hr/day medical supervision in concert with the patient's rehab needs make it unreasonable for this patient to be served in a less intensive setting? Yes 2. Co-Morbidities requiring supervision/potential complications: hx DVT, pain mgt, ID mgt, wound care 3. Due to bladder management, bowel management, safety, skin/wound care, disease management, medication administration, pain management and patient education, does the patient require 24 hr/day rehab nursing? Yes 4. Does the patient require coordinated care of a physician, rehab nurse, PT (1-2 hrs/day, 5 days/week) and OT (1-2 hrs/day, 5 days/week) to address physical and functional deficits in the context of the above medical diagnosis(es)? Yes Addressing deficits in the following areas: balance, endurance, locomotion, strength, transferring, bowel/bladder control, bathing, dressing, feeding, grooming, toileting and psychosocial support 5. Can the patient actively participate in an intensive therapy program of at least 3 hrs of therapy per  day at least 5 days per week? Yes 6. The potential for patient to make measurable gains while on inpatient rehab is excellent 7. Anticipated functional outcomes upon discharge from inpatient rehab are modified independent and supervision  with PT, modified independent, supervision and min assist with OT, n/a with SLP. 8. Estimated rehab length of stay to reach the above functional goals is: potentially 14-20 days 9. Does the patient have adequate social supports and living environment to accommodate these discharge functional goals? Yes 10. Anticipated D/C setting: Home 11. Anticipated post D/C treatments: HH therapy and Outpatient therapy 12. Overall  Rehab/Functional Prognosis: excellent  RECOMMENDATIONS: This patient's condition is appropriate for continued rehabilitative care in the following setting: CIR Patient has agreed to participate in recommended program. Yes Note that insurance prior authorization may be required for reimbursement for recommended care.  Comment: Rehab Admissions Coordinator to follow up.  Thanks,  Ranelle Oyster, MD, [place] [facility]     08/26/2015

## 2015-08-26 NOTE — Progress Notes (Signed)
OT Cancellation Note  Patient Details Name: Raymond CraverJeremy D Bonura MRN: [id number] DOB: 1971/08/05   Cancelled Treatment:    Reason Eval/Treat Not Completed: Pain limiting ability to participate. Pt refused therapy at this time due to pain and being NPO for procedure. Pt given max verbal encouragement and explained that he will be unable to go to post acute rehab until PT/OT evals are completed; pt requesting that therapy come back at another time after he has been able to eat and get more pain medication. Will check back for OT eval as time allows and pt is appropriate.   Gaye AlkenBailey A Anneliese Leblond M.S., OTR/L Pager: [phone]  08/26/2015, 11:32 AM

## 2015-08-27 MED ORDER — METHYLPREDNISOLONE SODIUM SUCC 125 MG IJ SOLR
80.0000 mg | Freq: Once | INTRAMUSCULAR | Status: AC
  Administered 2015-08-27: 80 mg via INTRAVENOUS
  Filled 2015-08-27: qty 2

## 2015-08-27 MED ORDER — FAMOTIDINE 20 MG PO TABS
20.0000 mg | ORAL_TABLET | Freq: Every day | ORAL | Status: DC
  Administered 2015-08-27 – 2015-08-30 (×4): 20 mg via ORAL
  Filled 2015-08-27 (×4): qty 1

## 2015-08-27 MED ORDER — DIPHENHYDRAMINE HCL 12.5 MG/5ML PO ELIX
25.0000 mg | ORAL_SOLUTION | Freq: Four times a day (QID) | ORAL | Status: DC | PRN
  Administered 2015-08-27 – 2015-08-28 (×5): 25 mg via ORAL
  Filled 2015-08-27 (×4): qty 10

## 2015-08-27 NOTE — Progress Notes (Signed)
Medical consultation follow-up progress note        PATIENT DETAILS Name: Raymond Peterson Age: 44 y.o. Sex: male Date of Birth: 1971-02-04 Admit Date: 08/24/2015 Admitting Physician Francena HanlyKevin Supple, MD PCP:Pcp Not In System   Brief narrative: 44 year old male with recent history of left shoulder and left knee septic arthritis on IV Ancef, subsequent recent diagnosis of left leg DVT admitted by orthopedic service for concerns of ongoing/recurrent left knee septic arthritis. Hospitalist service consulted to manage medical issues.   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 TEE negative for vegetations. Ensure follow up with ID as outpatient.  Left leg DVT: Continue Xarelto  Elevated LFTs: Abdomen is benign, hep B & C serology negative, Limited RUQ US-no major anormalities except for sludge-stable for further workup as outpatient.  Hives:?culprit med-stop Miralax/Senna-one dose of IV Solumedrol, continue Pepcid/Benadryl-follow  Disposition: Remain inpatient-CIR when bed available-per primary service  Antimicrobial agents  See below  Anti-infectives    Start     Dose/Rate Route Frequency Ordered Stop   08/25/15 1400  ceFAZolin (ANCEF) IVPB 2 g/50 mL premix  Status:  Discontinued     2 g 100 mL/hr over 30 Minutes Intravenous 3 times per day 08/25/15 1308 08/25/15 1330   08/25/15 0945  ceFAZolin (ANCEF) IVPB 2 g/50 mL premix     2 g 100 mL/hr over 30 Minutes Intravenous On call to O.R. 08/24/15 1207 08/25/15 1015   08/24/15 1400  ceFAZolin (ANCEF) IVPB 2 g/50 mL premix     2 g 100 mL/hr over 30 Minutes Intravenous 3 times per day 08/24/15 1133        DVT Prophylaxis: Xarelto  Code Status: Full code   Family Communication Parents at bedside  Procedures: None  Time spent 20 minutes-Greater than 50% of this time was spent in  counseling, explanation of diagnosis, planning of further management, and coordination of care.  MEDICATIONS: Scheduled Meds: .  ceFAZolin (ANCEF) IV  2 g Intravenous 3 times per day  . famotidine  20 mg Oral Daily  . polyethylene glycol  17 g Oral Daily  . rivaroxaban  15 mg Oral BID WC   Followed by  . [START ON 09/10/2015] rivaroxaban  20 mg Oral Q supper  . zolpidem  10 mg Oral QHS   Continuous Infusions: . sodium chloride 20 mL/hr at 08/26/15 1028  . lactated ringers 10 mL/hr at 08/25/15 1630   PRN Meds:.acetaminophen **OR** acetaminophen, diphenhydrAMINE, HYDROmorphone (DILAUDID) injection, [DISCONTINUED] methocarbamol **OR** methocarbamol (ROBAXIN)  IV, methocarbamol, [DISCONTINUED] metoCLOPramide **OR** metoCLOPramide (REGLAN) injection, ondansetron **OR** ondansetron (ZOFRAN) IV, oxyCODONE, promethazine, sodium chloride, sodium phosphate    PHYSICAL EXAM: Vital signs in last 24 hours: Filed Vitals:   08/26/15 1600 08/26/15 2024 08/26/15 2335 08/27/15 0511  BP:  123/69  131/74  Pulse: 102 109  110  Temp:  100.4 F (38 C) 98.6 F (37 C) 99.9 F (37.7 C)  TempSrc:  Oral Oral Oral  Resp: 11 12  14   Height:      Weight:      SpO2: 93% 94%  94%    Weight change:  Filed Weights   08/24/15 1700 08/26/15 1441  Weight: 115.7 kg (255 lb 1.2 oz) 115.667 kg (255 lb)   Body mass index is 35.58 kg/(m^2).   Gen Exam: Awake and alert with clear speech.   Neck: Supple,  No JVD.   Chest: B/L Clear.   CVS: S1 S2 Regular, no murmurs.  Abdomen: soft, BS +, non tender, non distended.  Neurologic: Non Focal.   Wounds: N/A.   Intake/Output from previous day:  Intake/Output Summary (Last 24 hours) at 08/27/15 1303 Last data filed at 08/27/15 0900  Gross per 24 hour  Intake    240 ml  Output   1010 ml  Net   -770 ml     LAB RESULTS: CBC  Recent Labs Lab 08/24/15 1321 08/26/15 0530  WBC 10.3 7.2  HGB 12.6* 12.1*  HCT 38.9* 37.6*  PLT 471* 488*  MCV 92.4 93.1  MCH  29.9 30.0  MCHC 32.4 32.2  RDW 13.0 13.0  LYMPHSABS 0.9  --   MONOABS 0.6  --   EOSABS 0.0  --   BASOSABS 0.0  --     Chemistries   Recent Labs Lab 08/24/15 1321 08/26/15 0530  NA 135 136  K 4.1 4.5  CL 100* 99*  CO2 25 29  GLUCOSE 161* 109*  BUN 14 14  CREATININE 0.78 0.82  CALCIUM 8.7* 8.5*    CBG: No results for input(s): GLUCAP in the last 168 hours.  GFR Estimated Creatinine Clearance: 148.8 mL/min (by C-G formula based on Cr of 0.82).  Coagulation profile No results for input(s): INR, PROTIME in the last 168 hours.  Cardiac Enzymes No results for input(s): CKMB, TROPONINI, MYOGLOBIN in the last 168 hours.  Invalid input(s): CK  Invalid input(s): POCBNP No results for input(s): DDIMER in the last 72 hours. No results for input(s): HGBA1C in the last 72 hours. No results for input(s): CHOL, HDL, LDLCALC, TRIG, CHOLHDL, LDLDIRECT in the last 72 hours. No results for input(s): TSH, T4TOTAL, T3FREE, THYROIDAB in the last 72 hours.  Invalid input(s): FREET3 No results for input(s): VITAMINB12, FOLATE, FERRITIN, TIBC, IRON, RETICCTPCT in the last 72 hours. No results for input(s): LIPASE, AMYLASE in the last 72 hours.  Urine Studies No results for input(s): UHGB, CRYS in the last 72 hours.  Invalid input(s): UACOL, UAPR, USPG, UPH, UTP, UGL, UKET, UBIL, UNIT, UROB, ULEU, UEPI, UWBC, URBC, UBAC, CAST, UCOM, BILUA  MICROBIOLOGY: Recent Results (from the past 240 hour(s))  Culture, blood (routine x 2)     Status: None   Collection Time: 08/19/15  1:48 PM  Result Value Ref Range Status   Specimen Description BLOOD LEFT HAND  Final   Special Requests BOTTLES DRAWN AEROBIC AND ANAEROBIC 5CC  Final   Culture NO GROWTH 5 DAYS  Final   Report Status 08/24/2015 FINAL  Final  Culture, blood (routine x 2)     Status: None   Collection Time: 08/19/15  3:08 PM  Result Value Ref Range Status   Specimen Description BLOOD LEFT HAND  Final   Special Requests IN  PEDIATRIC BOTTLE 1CC  Final   Culture NO GROWTH 5 DAYS  Final   Report Status 08/24/2015 FINAL  Final    RADIOLOGY STUDIES/RESULTS: Ct Angio Chest Pe W/cm &/or Wo Cm  08/19/2015  CLINICAL DATA:  Status post left knee and left shoulder surgery, with worsening pain, swelling and fever at the left leg. Diagnosed with left leg DVT. Assess for pulmonary embolus. Initial encounter. EXAM: CT ANGIOGRAPHY CHEST WITH CONTRAST TECHNIQUE: Multidetector CT imaging of the chest was performed using the standard protocol during bolus administration of intravenous contrast. Multiplanar CT image reconstructions and MIPs were obtained to evaluate the vascular anatomy. CONTRAST:  OMNIPAQUE IOHEXOL  350 MG/ML SOLN COMPARISON:  Chest radiograph performed 08/12/2015 FINDINGS: There is no evidence of central pulmonary embolus. However, there are a number of potential vague filling defects in the periphery of the pulmonary arteries bilaterally. This may simply reflect motion artifact or beam hardening artifact, though segmental pulmonary embolus cannot be excluded on the basis of this study. Minimal left-sided atelectasis is noted. The lungs are otherwise clear. There is no evidence of significant focal consolidation, pleural effusion or pneumothorax. No masses are identified; no abnormal focal contrast enhancement is seen. The mediastinum is unremarkable in appearance. No mediastinal lymphadenopathy is seen. No pericardial effusion is identified. The great vessels are grossly unremarkable in appearance. No axillary lymphadenopathy is seen. The visualized portions of the thyroid gland are unremarkable in appearance. The visualized portions of the liver and spleen are unremarkable. No acute osseous abnormalities are seen. Review of the MIP images confirms the above findings. IMPRESSION: 1. No evidence of central pulmonary embolus. However, there are number of potential vague filling defects in the periphery of the pulmonary  arteries bilaterally; these may simply be artifactual, though segmental pulmonary embolus cannot be excluded on the basis of this study. Depending on the patient's symptoms, a repeat CTA of the chest could be considered. Note that a new peripheral IV catheter would be required; the patient's existing power PICC limited the rate of injection of contrast on this study, corresponding to the suboptimal results. 2. Minimal left-sided atelectasis noted.  Lungs otherwise clear. Electronically Signed   By: Roanna Raider M.D.   On: 08/19/2015 18:33   Dg Chest Port 1 View  08/12/2015  CLINICAL DATA:  Right PICC line insertion, left knee infection, access for long-term antibiotics EXAM: PORTABLE CHEST 1 VIEW COMPARISON:  None. FINDINGS: Right PICC line mid SVC level. Mild cardiac enlargement and prominent hilar vascularity. Minimal basilar atelectasis. No focal pneumonia, collapse or consolidation. No edema, effusion or pneumothorax. Trachea midline. No acute osseous finding. Monitor leads overlie the chest. IMPRESSION: Right PICC line tip mid SVC level. Cardiomegaly without CHF Basilar atelectasis Electronically Signed   By: Judie Petit.  Shick M.D.   On: 08/12/2015 08:56   US Abdomen Limited Ruq  08/26/2015  CLINICAL DATA:  44 year old male with abnormal liver enzymes. Recent knee and shoulder surgery with fever and pain. Initial encounter. EXAM: US ABDOMEN LIMITED - RIGHT UPPER QUADRANT COMPARISON:  Chest CTA 08/19/2015 FINDINGS: Gallbladder: Subtotally filled with echogenic sludge. No shadowing echogenic stones identified. Gallbladder wall thickness remains normal. No pericholecystic fluid. No sonographic Murphy sign elicited. Common bile duct: Diameter: 4 mm, normal Liver: Echogenicity within normal limits. No discrete liver lesion or intrahepatic biliary ductal dilatation identified. Other findings: Negative visible right kidney. IMPRESSION: 1. Gallbladder is filled with sludge. 2. No evidence of acute cholecystitis or  acute biliary obstruction. Electronically Signed   By: Odessa Fleming M.D.   On: 08/26/2015 09:51    Jeoffrey Massed, MD  [facility] Pager:[phone]  If 7PM-7AM, please contact night-coverage [email or web address] Password [email or web address] 08/27/2015, 1:03 PM   LOS: 3 days

## 2015-08-27 NOTE — Evaluation (Signed)
Physical Therapy Evaluation Patient Details Name: Raymond CraverJeremy D Velie MRN: [id number] DOB: Feb 06, 1971 Today's Date: 08/27/2015   History of Present Illness  44 y.o. male who presents for ongoing tx of left knee. 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 Infectious disease was consulted at that time and he was started on four weeks of IV Ancef. He was discharged following surgery and has been home with worsening swelling and poor mobility. Pt diagnosed with DVT and started on xarelto. Pt admitted for arthroscopy and lavage of left knee  Clinical Impression  Mr.Roat much more receptive to therapy today with premedication and Dilaudid received at initiation of eval. Pt able to transfer OOB and perform limited gait. Strength and ROM of left knee remain remarkably reduced with pt education for positioning in extension at rest, use of CPM and HEP to perform throughout the day. In chair pt unable to tolerate zero degree foam and agreeable to small towel roll under left heel only. Pt has not been in CPM today and encouraged to request use after lunch. Pt with decreased mobility, strength and function who will benefit from acute therapy to maximize mobility and function to return pt to independent level. Pt desires CIR and could benefit to maximize function for increased speed of return to his home.     Follow Up Recommendations CIR;Supervision for mobility/OOB    Equipment Recommendations  None recommended by PT    Recommendations for Other Services OT consult;Rehab consult     Precautions / Restrictions Precautions Precautions: Fall Restrictions LUE Weight Bearing: Weight bearing as tolerated LLE Weight Bearing: Weight bearing as tolerated      Mobility  Bed Mobility Overal bed mobility: Modified Independent             General bed mobility comments: increased time with use of rail,  RLE to assist moving LLE OOB  Transfers Overall transfer level: Needs assistance   Transfers: Sit to/from Stand Sit to Stand: Min guard         General transfer comment: cues for hand and LLE placement  Ambulation/Gait Ambulation/Gait assistance: Min guard Ambulation Distance (Feet): 60 Feet Assistive device: Rolling walker (2 wheeled) Gait Pattern/deviations: Step-to pattern   Gait velocity interpretation: Below normal speed for age/gender General Gait Details: cues for sequence with pt bearing some weight on LLE with heel flat but remains with knee flexion throughout all movement. Cues for heel strike LLE, increased weight bearing, posture and breathing with chair to follow to maximize gait  Stairs            Wheelchair Mobility    Modified Rankin (Stroke Patients Only)       Balance                                             Pertinent Vitals/Pain Pain Assessment: 0-10 Pain Score: 7  Pain Location: left knee Pain Descriptors / Indicators: Throbbing;Aching Pain Intervention(s): Limited activity within patient's tolerance;Monitored during session;Premedicated before session;Repositioned;Ice applied    Home Living Family/patient expects to be discharged to:: Private residence Living Arrangements: Parent Available Help at Discharge: Family;Available 24 hours/day Type of Home: House Home Access: Ramped entrance     Home Layout: One level Home Equipment: Crutches;Wheelchair - [employer]manual;Walker - 2 wheels;Bedside commode  Prior Function     Gait / Transfers Assistance Needed: limited transfers and no gait since D/C home last admission  ADL's / Homemaking Assistance Needed: assist for dressing/bathing and setup from family for all activity since D/C last admission        Hand Dominance        Extremity/Trunk Assessment   Upper Extremity Assessment: Defer to OT evaluation           Lower Extremity Assessment: RLE  deficits/detail;LLE deficits/detail RLE Deficits / Details: WFL LLE Deficits / Details: decreased ROM grossly 15 degrees total with extension limited at 20 degrees, strength grossly 2-/5  Cervical / Trunk Assessment: Normal  Communication   Communication: No difficulties  Cognition Arousal/Alertness: Awake/alert Behavior During Therapy: Flat affect Overall Cognitive Status: Within Functional Limits for tasks assessed                      General Comments      Exercises General Exercises - Lower Extremity Ankle Circles/Pumps: AROM;Both;5 reps Quad Sets:  (pt attempting but only 1/5 contraction noted) Long Arc Quad: AAROM;5 reps;Seated;Left Heel Slides: AAROM;Left;5 reps;Seated Hip ABduction/ADduction: AAROM;Left;10 reps;Seated      Assessment/Plan    PT Assessment Patient needs continued PT services  PT Diagnosis Difficulty walking;Generalized weakness;Acute pain   PT Problem List Decreased strength;Decreased range of motion;Decreased activity tolerance;Decreased balance;Decreased mobility;Decreased knowledge of use of DME;Decreased knowledge of precautions;Pain  PT Treatment Interventions DME instruction;Gait training;Functional mobility training;Therapeutic activities;Therapeutic exercise;Patient/family education   PT Goals (Current goals can be found in the Care Plan section) Acute Rehab PT Goals Patient Stated Goal: be able to walk and go home PT Goal Formulation: With patient Time For Goal Achievement: 09/10/15 Potential to Achieve Goals: Good    Frequency Min 5X/week   Barriers to discharge        Co-evaluation               End of Session Equipment Utilized During Treatment: Gait belt Activity Tolerance: Patient tolerated treatment well Patient left: in chair;with call bell/phone within reach;with family/visitor present Nurse Communication: Mobility status;Weight bearing status         Time: 1111-1151 PT Time Calculation (min) (ACUTE ONLY):  40 min   Charges:   PT Evaluation $Initial PT Evaluation Tier I: 1 Procedure PT Treatments $Gait Training: 8-22 mins $Therapeutic Exercise: 8-22 mins   PT G Codes:        Delorse Lek 08/27/2015, 12:03 PM Delaney Meigs, PT [phone]

## 2015-08-27 NOTE — Progress Notes (Signed)
Subjective: 1 Day Post-Op Procedure(s) (LRB): TRANSESOPHAGEAL ECHOCARDIOGRAM (TEE) (N/A)  2 days post-op repeat I&D L knee  Patient reports pain as moderate to severe in the L knee. Shoulder pain well controlled. No other c/o this AM. Did not participate in PT yesterday but did eat breakfast today. Asking for pain meds prior to PT.  TEE with no vegetations  Objective: Vital signs in last 24 hours: Temp:  [98.6 F (37 C)-100.4 F (38 C)] 99.9 F (37.7 C) (11/19 0511) Pulse Rate:  [102-114] 110 (11/19 0511) Resp:  [11-32] 14 (11/19 0511) BP: (118-143)/(69-83) 131/74 mmHg (11/19 0511) SpO2:  [92 %-99 %] 94 % (11/19 0511) Weight:  [115.667 kg (255 lb)] 115.667 kg (255 lb) (11/18 1441)  Intake/Output from previous day: 11/18 0701 - 11/19 0700 In: 0  Out: 1310 [Urine:1300; Drains:10] Intake/Output this shift:     Recent Labs  08/24/15 1321 08/26/15 0530  HGB 12.6* 12.1*    Recent Labs  08/24/15 1321 08/26/15 0530  WBC 10.3 7.2  RBC 4.21* 4.04*  HCT 38.9* 37.6*  PLT 471* 488*    Recent Labs  08/24/15 1321 08/26/15 0530  NA 135 136  K 4.1 4.5  CL 100* 99*  CO2 25 29  BUN 14 14  CREATININE 0.78 0.82  GLUCOSE 161* 109*  CALCIUM 8.7* 8.5*   No results for input(s): LABPT, INR in the last 72 hours.  Neurologically intact ABD soft Neurovascular intact Sensation intact distally Intact pulses distally Dorsiflexion/Plantar flexion intact Incision: dressing C/D/I and no drainage No cellulitis present Compartment soft no sign of DVT Pulled hemovac drain tip intact  Assessment/Plan: 1 Day Post-Op Procedure(s) (LRB): TRANSESOPHAGEAL ECHOCARDIOGRAM (TEE) (N/A)  2 days s/p repeat I&D L knee  Advance diet Up with therapy  WBAT Plan D/C to rehab when ready  Raymond Peterson, Raymond ParesJACLYN M. 08/27/2015, 8:52 AM

## 2015-08-28 MED ORDER — SENNA 8.6 MG PO TABS
2.0000 | ORAL_TABLET | Freq: Every day | ORAL | Status: DC
  Filled 2015-08-28: qty 2

## 2015-08-28 MED ORDER — DIPHENHYDRAMINE HCL 12.5 MG/5ML PO ELIX
25.0000 mg | ORAL_SOLUTION | ORAL | Status: DC | PRN
  Administered 2015-08-28 – 2015-08-30 (×10): 25 mg via ORAL
  Filled 2015-08-28 (×11): qty 10

## 2015-08-28 NOTE — Progress Notes (Signed)
Medical consultation follow-up progress note        PATIENT DETAILS Name: Raymond Peterson Age: 44 y.o. Sex: male Date of Birth: 04/17/1971 Admit Date: 08/24/2015 Admitting Physician Francena Hanly, MD PCP:Pcp Not In System   Brief narrative: 44 year old male with recent history of left shoulder and left knee septic arthritis on IV Ancef, subsequent recent diagnosis of left leg DVT admitted by orthopedic service for concerns of ongoing/recurrent left knee septic arthritis. Hospitalist service consulted to manage medical issues.   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 TEE negative for vegetations. Ensure follow up with ID as outpatient.  Left leg DVT: Continue Xarelto  Elevated LFTs: Abdomen is benign, hep B & C serology negative, Limited RUQ US-no major anormalities except for sludge-stable for further workup as outpatient.  Hives:resolved-continue prn Benadryl  Hospitalist service will sign off. Please reconsult prn  Disposition: Remain inpatient-CIR when bed available-per primary service  Antimicrobial agents  See below  Anti-infectives    Start     Dose/Rate Route Frequency Ordered Stop   08/25/15 1400  ceFAZolin (ANCEF) IVPB 2 g/50 mL premix  Status:  Discontinued     2 g 100 mL/hr over 30 Minutes Intravenous 3 times per day 08/25/15 1308 08/25/15 1330   08/25/15 0945  ceFAZolin (ANCEF) IVPB 2 g/50 mL premix     2 g 100 mL/hr over 30 Minutes Intravenous On call to O.R. 08/24/15 1207 08/25/15 1015   08/24/15 1400  ceFAZolin (ANCEF) IVPB 2 g/50 mL premix     2 g 100 mL/hr over 30 Minutes Intravenous 3 times per day 08/24/15 1133        DVT Prophylaxis: Xarelto  Code Status: Full code   Family Communication Mother at bedside  Procedures: None  Time spent 20 minutes-Greater than 50% of this time was spent in counseling, explanation  of diagnosis, planning of further management, and coordination of care.  MEDICATIONS: Scheduled Meds: .  ceFAZolin (ANCEF) IV  2 g Intravenous 3 times per day  . famotidine  20 mg Oral Daily  . polyethylene glycol  17 g Oral Daily  . rivaroxaban  15 mg Oral BID WC   Followed by  . [START ON 09/10/2015] rivaroxaban  20 mg Oral Q supper  . zolpidem  10 mg Oral QHS   Continuous Infusions: . sodium chloride 20 mL/hr at 08/26/15 1028  . lactated ringers 10 mL/hr at 08/25/15 1630   PRN Meds:.acetaminophen **OR** acetaminophen, diphenhydrAMINE, HYDROmorphone (DILAUDID) injection, [DISCONTINUED] methocarbamol **OR** methocarbamol (ROBAXIN)  IV, methocarbamol, [DISCONTINUED] metoCLOPramide **OR** metoCLOPramide (REGLAN) injection, ondansetron **OR** ondansetron (ZOFRAN) IV, oxyCODONE, promethazine, sodium chloride, sodium phosphate    PHYSICAL EXAM: Vital signs in last 24 hours: Filed Vitals:   08/27/15 0511 08/27/15 1349 08/27/15 1948 08/28/15 0621  BP: 131/74 136/75 138/78 130/82  Pulse: 110 114 100 100  Temp: 99.9 F (37.7 C) 99.4 F (37.4 C) 98.8 F (37.1 C) 99 F (37.2 C)  TempSrc: Oral Oral Oral Oral  Resp: Height:      Weight:      SpO2: 94% 94% 94% 97%    Weight change:  Filed Weights   08/24/15 1700 08/26/15 1441  Weight: 115.7 kg (255 lb 1.2 oz) 115.667 kg (255 lb)   Body mass index is 35.58 kg/(m^2).   Gen Exam: Awake and alert with clear  speech.   Neck: Supple, No JVD.   Chest: B/L Clear.   CVS: S1 S2 Regular, no murmurs.  Abdomen: soft, BS +, non tender, non distended.  Neurologic: Non Focal.   Wounds: N/A.   Intake/Output from previous day:  Intake/Output Summary (Last 24 hours) at 08/28/15 1034 Last data filed at 08/28/15 0600  Gross per 24 hour  Intake    700 ml  Output    850 ml  Net   -150 ml     LAB RESULTS: CBC  Recent Labs Lab 08/24/15 1321 08/26/15 0530  WBC 10.3 7.2  HGB 12.6* 12.1*  HCT 38.9* 37.6*  PLT 471* 488*    MCV 92.4 93.1  MCH 29.9 30.0  MCHC 32.4 32.2  RDW 13.0 13.0  LYMPHSABS 0.9  --   MONOABS 0.6  --   EOSABS 0.0  --   BASOSABS 0.0  --     Chemistries   Recent Labs Lab 08/24/15 1321 08/26/15 0530  NA 135 136  K 4.1 4.5  CL 100* 99*  CO2 25 29  GLUCOSE 161* 109*  BUN 14 14  CREATININE 0.78 0.82  CALCIUM 8.7* 8.5*    CBG: No results for input(s): GLUCAP in the last 168 hours.  GFR Estimated Creatinine Clearance: 148.8 mL/min (by C-G formula based on Cr of 0.82).  Coagulation profile No results for input(s): INR, PROTIME in the last 168 hours.  Cardiac Enzymes No results for input(s): CKMB, TROPONINI, MYOGLOBIN in the last 168 hours.  Invalid input(s): CK  Invalid input(s): POCBNP No results for input(s): DDIMER in the last 72 hours. No results for input(s): HGBA1C in the last 72 hours. No results for input(s): CHOL, HDL, LDLCALC, TRIG, CHOLHDL, LDLDIRECT in the last 72 hours. No results for input(s): TSH, T4TOTAL, T3FREE, THYROIDAB in the last 72 hours.  Invalid input(s): FREET3 No results for input(s): VITAMINB12, FOLATE, FERRITIN, TIBC, IRON, RETICCTPCT in the last 72 hours. No results for input(s): LIPASE, AMYLASE in the last 72 hours.  Urine Studies No results for input(s): UHGB, CRYS in the last 72 hours.  Invalid input(s): UACOL, UAPR, USPG, UPH, UTP, UGL, UKET, UBIL, UNIT, UROB, ULEU, UEPI, UWBC, URBC, UBAC, CAST, UCOM, BILUA  MICROBIOLOGY: Recent Results (from the past 240 hour(s))  Culture, blood (routine x 2)     Status: None   Collection Time: 08/19/15  1:48 PM  Result Value Ref Range Status   Specimen Description BLOOD LEFT HAND  Final   Special Requests BOTTLES DRAWN AEROBIC AND ANAEROBIC 5CC  Final   Culture NO GROWTH 5 DAYS  Final   Report Status 08/24/2015 FINAL  Final  Culture, blood (routine x 2)     Status: None   Collection Time: 08/19/15  3:08 PM  Result Value Ref Range Status   Specimen Description BLOOD LEFT HAND  Final    Special Requests IN PEDIATRIC BOTTLE 1CC  Final   Culture NO GROWTH 5 DAYS  Final   Report Status 08/24/2015 FINAL  Final    RADIOLOGY STUDIES/RESULTS: Ct Angio Chest Pe W/cm &/or Wo Cm  08/19/2015  CLINICAL DATA:  Status post left knee and left shoulder surgery, with worsening pain, swelling and fever at the left leg. Diagnosed with left leg DVT. Assess for pulmonary embolus. Initial encounter. EXAM: CT ANGIOGRAPHY CHEST WITH CONTRAST TECHNIQUE: Multidetector CT imaging of the chest was performed using the standard protocol during bolus administration of intravenous contrast. Multiplanar CT image reconstructions and MIPs were obtained to evaluate the  vascular anatomy. CONTRAST:  100mL OMNIPAQUE IOHEXOL 350 MG/ML SOLN COMPARISON:  Chest radiograph performed 08/12/2015 FINDINGS: There is no evidence of central pulmonary embolus. However, there are a number of potential vague filling defects in the periphery of the pulmonary arteries bilaterally. This may simply reflect motion artifact or beam hardening artifact, though segmental pulmonary embolus cannot be excluded on the basis of this study. Minimal left-sided atelectasis is noted. The lungs are otherwise clear. There is no evidence of significant focal consolidation, pleural effusion or pneumothorax. No masses are identified; no abnormal focal contrast enhancement is seen. The mediastinum is unremarkable in appearance. No mediastinal lymphadenopathy is seen. No pericardial effusion is identified. The great vessels are grossly unremarkable in appearance. No axillary lymphadenopathy is seen. The visualized portions of the thyroid gland are unremarkable in appearance. The visualized portions of the liver and spleen are unremarkable. No acute osseous abnormalities are seen. Review of the MIP images confirms the above findings. IMPRESSION: 1. No evidence of central pulmonary embolus. However, there are number of potential vague filling defects in the periphery  of the pulmonary arteries bilaterally; these may simply be artifactual, though segmental pulmonary embolus cannot be excluded on the basis of this study. Depending on the patient's symptoms, a repeat CTA of the chest could be considered. Note that a new peripheral IV catheter would be required; the patient's existing power PICC limited the rate of injection of contrast on this study, corresponding to the suboptimal results. 2. Minimal left-sided atelectasis noted.  Lungs otherwise clear. Electronically Signed   By: Roanna RaiderJeffery  Chang M.D.   On: 08/19/2015 18:33   Dg Chest Port 1 View  08/12/2015  CLINICAL DATA:  Right PICC line insertion, left knee infection, access for long-term antibiotics EXAM: PORTABLE CHEST 1 VIEW COMPARISON:  None. FINDINGS: Right PICC line mid SVC level. Mild cardiac enlargement and prominent hilar vascularity. Minimal basilar atelectasis. No focal pneumonia, collapse or consolidation. No edema, effusion or pneumothorax. Trachea midline. No acute osseous finding. Monitor leads overlie the chest. IMPRESSION: Right PICC line tip mid SVC level. Cardiomegaly without CHF Basilar atelectasis Electronically Signed   By: Judie PetitM.  Shick M.D.   On: 08/12/2015 08:56   [place] Abdomen Limited Ruq  08/26/2015  CLINICAL DATA:  44 year old male with abnormal liver enzymes. Recent knee and shoulder surgery with fever and pain. Initial encounter. EXAM: US ABDOMEN LIMITED - RIGHT UPPER QUADRANT COMPARISON:  Chest CTA 08/19/2015 FINDINGS: Gallbladder: Subtotally filled with echogenic sludge. No shadowing echogenic stones identified. Gallbladder wall thickness remains normal. No pericholecystic fluid. No sonographic Murphy sign elicited. Common bile duct: Diameter: 4 mm, normal Liver: Echogenicity within normal limits. No discrete liver lesion or intrahepatic biliary ductal dilatation identified. Other findings: Negative visible right kidney. IMPRESSION: 1. Gallbladder is filled with sludge. 2. No evidence of acute  cholecystitis or acute biliary obstruction. Electronically Signed   By: Odessa FlemingH  Hall M.D.   On: 08/26/2015 09:51    Jeoffrey MassedGHIMIRE,SHANKER, MD  [facility] Pager:[phone]  If 7PM-7AM, please contact night-coverage [email or web address] Password [email or web address] 08/28/2015, 10:34 AM   LOS: 4 days

## 2015-08-28 NOTE — Progress Notes (Signed)
Patient ID: Raymond CraverJeremy D Peterson, male   DOB: 07-06-1971, 44 y.o.   MRN: [id number] Subjective: POD#3 from I&D left knee and shoulder    Patient reports pain as moderate but recognizable improvement from pre-operative state when seen in the office earlier in week  Objective:   VITALS:   Filed Vitals:   08/27/15 1948 08/28/15 0621  BP: 138/78 130/82  Pulse: 100 100  Temp: 98.8 F (37.1 C) 99 F (37.2 C)  Resp: 17 16    Neurovascular intact Incision: dressing C/D/I  Left shoulder with limited range of motion, actively but no real pain with movement  LABS  Recent Labs  08/26/15 0530  HGB 12.1*  HCT 37.6*  WBC 7.2  PLT 488*     Recent Labs  08/26/15 0530  NA 136  K 4.5  BUN 14  CREATININE 0.82  GLUCOSE 109*    No results for input(s): LABPT, INR in the last 72 hours.   Assessment/Plan: POD #3 from repeat I&Ds of left knee and shoulder   Continue ABX therapy due to Culture taken of surgical sites Patient states plan is to be discharged to CIR tomorrow,  ID on board (Staph cultured from joints)

## 2015-08-29 ENCOUNTER — Encounter (HOSPITAL_COMMUNITY): Payer: Self-pay | Admitting: Cardiology

## 2015-08-29 DIAGNOSIS — B9561 Methicillin susceptible Staphylococcus aureus infection as the cause of diseases classified elsewhere: Secondary | ICD-10-CM

## 2015-08-29 DIAGNOSIS — I82409 Acute embolism and thrombosis of unspecified deep veins of unspecified lower extremity: Secondary | ICD-10-CM

## 2015-08-29 DIAGNOSIS — M00012 Staphylococcal arthritis, left shoulder: Secondary | ICD-10-CM

## 2015-08-29 DIAGNOSIS — R748 Abnormal levels of other serum enzymes: Secondary | ICD-10-CM | POA: Insufficient documentation

## 2015-08-29 NOTE — Progress Notes (Addendum)
[facility] for Infectious Disease    Date of Admission:  08/24/2015   Total days of antibiotics 2 /15        Day 5 cefazolin           ID: Raymond Peterson is a 44 y.o. male with MSSA left knee septic arthritis s/p I x D on 11/16. Also being treated for left shoulder septic arthritis  Active Problems:   Septic joint of left knee joint (HCC)   Venous thromboembolism (VTE)   Abnormal LFTs   DVT of leg (deep venous thrombosis) (HCC)    Subjective: Remains afebrile.   Updates: TEE on 11/19 did not show any vegetations  Medications:  .  ceFAZolin (ANCEF) IV  2 g Intravenous 3 times per day  . famotidine  20 mg Oral Daily  . rivaroxaban  15 mg Oral BID WC   Followed by  . [START ON 09/10/2015] rivaroxaban  20 mg Oral Q supper  . senna  2 tablet Oral QHS  . zolpidem  10 mg Oral QHS    Objective: Vital signs in last 24 hours: Temp:  [98.2 F (36.8 C)-98.9 F (37.2 C)] 98.2 F (36.8 C) (11/21 0452) Pulse Rate:  [106-110] 106 (11/21 0452) Resp:  [16-17] 16 (11/21 0452) BP: (123-134)/(76-84) 127/84 mmHg (11/21 0452) SpO2:  [96 %] 96 % (11/21 0452)  Physical Exam  Constitutional: He is oriented to person, place, and time. He appears well-developed and well-nourished. No distress.  Ext: left knee unwrapped, swollen, no erythema, mild warmth. Trace edema to lower leg    Lab Results Lab Results  Component Value Date   ESRSEDRATE 112* 08/12/2015   Lab Results  Component Value Date   CRP 25.2* 08/12/2015    Microbiology: No tissue cx sent from or   Assessment/Plan: mssa septic arthritis of left knee and shoulder = will restart abtx clock using 11/17 as day 1 of 28. Continue on cefazolin 2gm IV Q 8hr. TEE negative for veg. We will see back in the ID clinic in 4 wk to decide if need to extend further to 6-8wk.  Dvt= continue with anticoagulation  Elevated alk phos = RUQ does not suggest having cholecystitis. Continue to monitor  Disposition = to home tomorrow  per Dr. Susie Cassette note.   Will sign off  We will see back in ID clinic in 3-4 wk  --------------------------home health abtx orders listed below----------------------------------------- Diagnosis: Multiple septic arthritis due to MSSA (left knee, and left shoulder)  Culture Result: MSSA  No Known Allergies  Discharge antibiotics: Per pharmacy protocol : cefazolin Duration: 4 wk but may need to extend to 6-8 wk pending improvement End Date: Dec 15th  [place] Per Protocol: Labs weekly while on IV antibiotics: x CBC with differential x CMP __ CRP --- only on last week of abtx (dec 12th) __ ESR----- only on last week of abtx (dec12th) __ Vancomycin trough  Fax weekly labs to [phone]  Clinic Follow Up Appt:  at [facility] in 3-4 wk     [place], [facility] for Infectious Diseases Cell: [phone] Pager: [phone]  08/29/2015, 10:42 AM

## 2015-08-29 NOTE — Progress Notes (Signed)
I met with pt and his Mom at bedside. I discussed that there is not an inpt rehab bed available for him today. I reviewed his other rehab options to include another inpt rehab facility, SNF or [facility]. Pt wants HH. I have discussed with RN CM. We will sign off. [phone]

## 2015-08-29 NOTE — Evaluation (Signed)
Occupational Therapy Evaluation Patient Details Name: Raymond CraverJeremy D Mazurek MRN: [id number] DOB: 04-05-1971 Today's Date: 08/29/2015    History of Present Illness 44 y.o. male who presents for ongoing tx of left knee. 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 Infectious disease was consulted at that time and he was started on four weeks of IV Ancef. He was discharged following surgery and has been home with worsening swelling and poor mobility. Pt diagnosed with DVT and started on xarelto. Pt admitted for arthroscopy and lavage of left knee   Clinical Impression   Pt was assisted for bathing and dressing just prior to readmission.  He presents with pain, limited use of L UE and impaired standing balance with decreased L knee ROM interfering with ability to perform ADL and ADL transfers. Educated in self ROM of L shoulder and to use L UE as much as possible in ADL. Instructed in hemitechniques for ADL.  Will follow acutely.    Follow Up Recommendations  Home health OT    Equipment Recommendations  None recommended by OT    Recommendations for Other Services       Precautions / Restrictions Precautions Precautions: Fall Precaution Comments: pt with hx of L rotator cuff tear Restrictions Weight Bearing Restrictions: Yes LUE Weight Bearing: Weight bearing as tolerated LLE Weight Bearing: Weight bearing as tolerated      Mobility Bed Mobility Overal bed mobility: Modified Independent             General bed mobility comments: flat bed, no use of rail, uses R LE to assist his L  Transfers Overall transfer level: Needs assistance Equipment used: Rolling walker (2 wheeled) Transfers: Sit to/from Stand Sit to Stand: Min guard         General transfer comment: good technique, increased time, bed in lowest position    Balance                                             ADL Overall ADL's : Needs assistance/impaired Eating/Feeding: Sitting;Independent   Grooming: Wash/dry hands;Wash/dry face;Set up;Sitting   Upper Body Bathing: Minimal assitance;Sitting Upper Body Bathing Details (indicate cue type and reason): pt educated in hemitechniques for bathing Lower Body Bathing: Minimal assistance;Sit to/from stand Lower Body Bathing Details (indicate cue type and reason): assist for L LE, able to cross R foot over L knee to bathe, could benefit from long bath sponge Upper Body Dressing : Minimal assistance;Sitting Upper Body Dressing Details (indicate cue type and reason): Instructed to dress L UE first and undress last and that front opening shirts are easier Lower Body Dressing: Minimal assistance;Sit to/from stand Lower Body Dressing Details (indicate cue type and reason): assist for L sock and to start pants over L LE first, then to dress R, can cross R foot over L knee to donn and doff R sock Toilet Transfer: Min guard;RW   Toileting- Clothing Manipulation and Hygiene: Min guard;Sit to/from stand       Functional mobility during ADLs: Min guard;Rolling walker General ADL Comments: Pt ambulated only around bed.     Vision     Perception     Praxis      Pertinent Vitals/Pain Faces Pain Scale: Hurts little more Pain Location: L knee Pain Descriptors / Indicators:  Sore Pain Intervention(s): Premedicated before session;Monitored during session;Repositioned;Limited activity within patient's tolerance     Hand Dominance Right   Extremity/Trunk Assessment Upper Extremity Assessment Upper Extremity Assessment: LUE deficits/detail LUE Deficits / Details: AAROM FF to 90 degrees in supine with L rotator cuff tear, shoulder depression WNL for pushing into standing and use of walker, full AROM elbow to hand with strength WNL LUE Coordination: decreased gross motor   Lower Extremity Assessment Lower Extremity Assessment: Defer to PT  evaluation   Cervical / Trunk Assessment Cervical / Trunk Assessment: Normal   Communication Communication Communication: No difficulties   Cognition Arousal/Alertness: Awake/alert Behavior During Therapy: Flat affect Overall Cognitive Status: Within Functional Limits for tasks assessed                     General Comments       Exercises Exercises: Other exercises Other Exercises Other Exercises: AAROM/self ROM L shoulder in supine   Shoulder Instructions      Home Living Family/patient expects to be discharged to:: Private residence Living Arrangements: Parent Available Help at Discharge: Family;Available 24 hours/day Type of Home: House Home Access: Ramped entrance     Home Layout: One level     Bathroom Shower/Tub: Tub/shower unit Shower/tub characteristics: Engineer, building services: Standard     Home Equipment: Crutches;Wheelchair - [employer] - 2 wheels;Bedside commode   Additional Comments: pt was staying with his father and step mother as wife has to manage their 55 y.o. son at home      Prior Functioning/Environment Level of Independence: Needs assistance  Gait / Transfers Assistance Needed: limited transfers and no gait since D/C home last admission ADL's / Homemaking Assistance Needed: assisted by step mother and wife for bathing and dressing since last admission        OT Diagnosis: Generalized weakness;Acute pain   OT Problem List: Decreased strength;Decreased range of motion;Decreased activity tolerance;Impaired balance (sitting and/or standing);Decreased safety awareness;Decreased knowledge of use of DME or AE;Decreased knowledge of precautions;Impaired UE functional use;Pain   OT Treatment/Interventions: Self-care/ADL training;Therapeutic exercise;DME and/or AE instruction;Patient/family education    OT Goals(Current goals can be found in the care plan section) Acute Rehab OT Goals Patient Stated Goal: be able to walk and go home OT  Goal Formulation: With patient Time For Goal Achievement: 09/05/15 Potential to Achieve Goals: Good ADL Goals Pt Will Perform Grooming: with supervision;standing Pt Will Perform Upper Body Bathing: with supervision;sitting Pt Will Perform Lower Body Bathing: with supervision;with adaptive equipment;sit to/from stand Pt Will Perform Upper Body Dressing: with supervision;sitting Pt Will Perform Lower Body Dressing: with supervision;with adaptive equipment;sit to/from stand Pt Will Transfer to Toilet: with supervision;ambulating;bedside commode (over toilet) Pt Will Perform Toileting - Clothing Manipulation and hygiene: with supervision;sit to/from stand Pt/caregiver will Perform Home Exercise Program: Left upper extremity;Independently (self ROM of shoulder within pain tolerance)  OT Frequency: Min 2X/week   Barriers to D/C:            Co-evaluation              End of Session Equipment Utilized During Treatment: Gait belt;Rolling walker  Activity Tolerance: Patient limited by pain Patient left: in bed;with call bell/phone within reach   Time: 8295-6213 OT Time Calculation (min): 24 min Charges:  OT General Charges $OT Visit: 1 Procedure OT Evaluation $Initial OT Evaluation Tier I: 1 Procedure OT Treatments $Self Care/Home Management : 8-22 mins G-Codes:    Evern Bio 08/29/2015, 10:21 AM  [phone]

## 2015-08-29 NOTE — Progress Notes (Signed)
Physical Therapy Treatment Patient Details Name: Raymond Peterson MRN: [id number] DOB: [birth year] Today's Date: 08/29/2015    History of Present Illness 44 y.o. male who presents for ongoing tx of left knee. 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 Infectious disease was consulted at that time and he was started on four weeks of IV Ancef. He was discharged following surgery and has been home with worsening swelling and poor mobility. Pt diagnosed with DVT and started on xarelto. Pt admitted for arthroscopy and lavage of left knee    PT Comments    Pt was more willing to perform exercises today. He continues to present with significant inflammation and knee ROM limitations that restrict appropriate gait pattern during ambulation. He had to perform compensatory movements in order to achieve necessary heel strike. Pt navigated 2 stairs today but family stated that they can reinstall a ramp to enter home. His endurance during amb continues to improve. Educated pt on importance of using CPM and foam heel prop to increase knee ROM.     Follow Up Recommendations        Equipment Recommendations       Recommendations for Other Services       Precautions / Restrictions Precautions Precautions: Fall Precaution Comments: pt with hx of L rotator cuff tear Restrictions Weight Bearing Restrictions: Yes LUE Weight Bearing: Weight bearing as tolerated LLE Weight Bearing: Weight bearing as tolerated    Mobility  Bed Mobility               General bed mobility comments: Pt positioned in chair upon arrival  Transfers Overall transfer level: Needs assistance Equipment used: Rolling walker (2 wheeled) Transfers: Sit to/from Stand Sit to Stand: Min guard         General transfer comment: increased time, limited L knee flexion caused to push off to stand with mostly R  leg.  Ambulation/Gait Ambulation/Gait assistance: Min guard Ambulation Distance (Feet): 100 Feet Assistive device: Rolling walker (2 wheeled) Gait Pattern/deviations: Step-to pattern;Decreased step length - left;Decreased stance time - left Gait velocity: very slow pattern.  Gait velocity interpretation: Below normal speed for age/gender General Gait Details: cues for sequence with pt bearing some weight on LLE with heel flat but remains with knee flexion throughout all movement. Cues for heel strike LLE, increased weight bearing with chair to follow to maximize gait. Improved posture and breathing today   Stairs Stairs: Yes Stairs assistance: Min assist Stair Management: No rails;Step to pattern;Backwards;With walker Number of Stairs: 2 General stair comments: min VC for sequencing. Guarding for steadiness and patient comfort  Wheelchair Mobility    Modified Rankin (Stroke Patients Only)       Balance Overall balance assessment: Needs assistance Sitting-balance support: No upper extremity supported Sitting balance-Leahy Scale: Good     Standing balance support: Bilateral upper extremity supported Standing balance-Leahy Scale: Poor Standing balance comment: Use of RW                    Cognition Arousal/Alertness: Awake/alert Behavior During Therapy: Flat affect Overall Cognitive Status: Within Functional Limits for tasks assessed                      Exercises General Exercises - Lower Extremity Quad Sets: AROM;Left;10 reps;Seated Long Arc Quad: AROM;Left;10 reps;Seated Heel Slides: AAROM;Left;10 reps (PT applied overpressure at endROM)  General Comments        Pertinent Vitals/Pain Pain Assessment: 0-10 Pain Score: 6  Pain Location: L knee Pain Descriptors / Indicators: Sore Pain Intervention(s): Limited activity within patient's tolerance;Repositioned;Monitored during session;Ice applied;Premedicated before session    Home Living                       Prior Function            PT Goals (current goals can now be found in the care plan section) Acute Rehab PT Goals Patient Stated Goal: be able to walk and go home PT Goal Formulation: With patient Time For Goal Achievement: 09/10/15 Potential to Achieve Goals: Good Progress towards PT goals: Progressing toward goals    Frequency  Min 5X/week    PT Plan Current plan remains appropriate    Co-evaluation             End of Session Equipment Utilized During Treatment: Gait belt Activity Tolerance: Patient limited by pain Patient left: in chair;with call bell/phone within reach;with family/visitor present     Time: [id number] PT Time Calculation (min) (ACUTE ONLY): 37 min  Charges:  $Gait Training: 8-22 mins $Therapeutic Exercise: 8-22 mins                    G CodesJimmy Picket:      May Ozment 08/29/2015, 2:59 PM Jimmy PicketJustin Novah Nessel, SPT 08/29/2015 2:59 PM

## 2015-08-29 NOTE — Progress Notes (Signed)
Raymond CraverJeremy D Litz  MRN: [id number] DOB/Age: 06-28-1971 44 y.o. Physician: Lynnea MaizesK Suhana Wilner, M.D. 3 Days Post-Op Procedure(s) (LRB): TRANSESOPHAGEAL ECHOCARDIOGRAM (TEE) (N/A)  Subjective: Reports ongoing pain left shoulder and knee, gradually improving however. Vital Signs Temp:  [98.2 F (36.8 C)-98.9 F (37.2 C)] 98.6 F (37 C) (11/21 1233) Pulse Rate:  [97-110] 97 (11/21 1233) Resp:  [16-17] 16 (11/21 1233) BP: (123-134)/(76-84) 127/81 mmHg (11/21 1233) SpO2:  [96 %-98 %] 98 % (11/21 1233)  Lab Results No results for input(s): WBC, HGB, HCT, PLT in the last 72 hours. BMET No results for input(s): NA, K, CL, CO2, GLUCOSE, BUN, CREATININE, CALCIUM in the last 72 hours. INR  Date Value Ref Range Status  08/11/2015 1.28 0.00 - 1.49 Final     Exam  Left shoulder without erythema or induration, poor active motion and strength, passive elevation to 80 degrees. Left knee with diffuse swelling, portals clean and dry, very poor quad tone, pain with attempts at rom, no cords.  Impression: 1 S/p I and D left shoulder, left kneex2 for MSSA septic arthritis. 2 Left knee with endstage OA. 3 poor mobility and ongoing severe functional limitations secondary to 1 and 2 above.  Plan Discussed again today the vital importance of improved mobility and aggressive exercise to optimize recovery and avoid further long term sequelae. Given poor function noted today, and fact that CIR is not available, will ask PT and OT to redouble efforts this afternoon and in am tomorrow, and [facility].  Tova Vater M Yakira Duquette 08/29/2015, 12:45 PM    Contact # [phone]

## 2015-08-30 MED ORDER — HEPARIN SOD (PORK) LOCK FLUSH 100 UNIT/ML IV SOLN
250.0000 [IU] | INTRAVENOUS | Status: AC | PRN
  Administered 2015-08-30: 250 [IU]

## 2015-08-30 MED ORDER — OXYCODONE HCL 5 MG PO TABS: 5.0000 mg | ORAL_TABLET | ORAL | Status: DC | PRN

## 2015-08-30 MED ORDER — CEFAZOLIN SODIUM-DEXTROSE 2-3 GM-% IV SOLR: 2.0000 g | Freq: Three times a day (TID) | INTRAVENOUS | Status: AC

## 2015-08-30 NOTE — Progress Notes (Signed)
Physical Therapy Treatment Patient Details Name: Raymond Peterson MRN: [id number] DOB: September 09, 1971 Today's Date: 08/30/2015    History of Present Illness 44 y.o. male who presents for ongoing tx of left knee. 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 Infectious disease was consulted at that time and he was started on four weeks of IV Ancef. He was discharged following surgery and has been home with worsening swelling and poor mobility. Pt diagnosed with DVT and started on xarelto. Pt admitted for arthroscopy and lavage of left knee    PT Comments    Pt continues with slow progression with mobility and HEP secondary to pain and edema. Emphasis to pt and dad today regarding heel foam use, ROM, and knee extension in stance for increased rate of improvement. Pt verbalized understanding. In chair end of session but denied use of heel foam.   Follow Up Recommendations  Home health PT;Supervision for mobility/OOB     Equipment Recommendations  None recommended by PT    Recommendations for Other Services       Precautions / Restrictions Precautions Precautions: Fall Restrictions LUE Weight Bearing: Weight bearing as tolerated LLE Weight Bearing: Weight bearing as tolerated    Mobility  Bed Mobility Overal bed mobility: Modified Independent             General bed mobility comments: increased time with HOB elevated  Transfers Overall transfer level: Needs assistance   Transfers: Sit to/from Stand Sit to Stand: Supervision         General transfer comment: supervision for safety and lines, pt maintains weight through RLE for transfers  Ambulation/Gait Ambulation/Gait assistance: Min guard Ambulation Distance (Feet): 100 Feet Assistive device: Rolling walker (2 wheeled) Gait Pattern/deviations: Step-to pattern;Decreased stance time - left;Decreased step length - left    Gait velocity interpretation: Below normal speed for age/gender General Gait Details: cues for increased heel strike and weight bearing on LLE as well as knee extension in stance. Pt maintains left knee flexion throughout. Chair to follow due to fatigue   Stairs            Wheelchair Mobility    Modified Rankin (Stroke Patients Only)       Balance                                    Cognition Arousal/Alertness: Awake/alert Behavior During Therapy: Flat affect Overall Cognitive Status: Within Functional Limits for tasks assessed                      Exercises General Exercises - Lower Extremity Quad Sets: AROM;Left;10 reps;Seated Long Arc Quad: AAROM;Seated;Left;10 reps Heel Slides: AAROM;Seated;Left;10 reps Hip Flexion/Marching: AAROM;Seated;Left;10 reps    General Comments        Pertinent Vitals/Pain Pain Score: 5  Pain Location: left knee Pain Descriptors / Indicators: Aching;Sore Pain Intervention(s): Premedicated before session;Monitored during session;Limited activity within patient's tolerance;Ice applied;Repositioned    Home Living                      Prior Function            PT Goals (current goals can now be found in the care plan section) Progress towards PT goals: Progressing toward goals (slowly remains limited by pain)    Frequency  PT Plan Discharge plan needs to be updated    Co-evaluation             End of Session   Activity Tolerance: Patient limited by pain Patient left: in chair;with call bell/phone within reach;with family/visitor present     Time: 1001-1031 PT Time Calculation (min) (ACUTE ONLY): 30 min  Charges:  $Gait Training: 8-22 mins $Therapeutic Exercise: 8-22 mins                    G Codes:      Delorse Lekabor, Abagail Limb Beth 08/30/2015, 10:49 AM  Delaney MeigsMaija Tabor Florella Mcneese, PT [phone]

## 2015-08-30 NOTE — Clinical Social Work Note (Signed)
CSW received referral for SNF.  Case discussed with case manager, and plan is to discharge home.  CSW to sign off please re-consult if social work needs arise.  Norberta Stobaugh R. Ilijah Doucet, MSW, LCSWA [phone]  

## 2015-08-30 NOTE — Progress Notes (Signed)
Occupational Therapy Treatment Patient Details Name: Raymond Peterson MRN: [id number] DOB: Feb 05, 1971 Today's Date: 08/30/2015    History of present illness 44 y.o. male who presents for ongoing tx of left knee. 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 Infectious disease was consulted at that time and he was started on four weeks of IV Ancef. He was discharged following surgery and has been home with worsening swelling and poor mobility. Pt diagnosed with DVT and started on xarelto. Pt admitted for arthroscopy and lavage of left knee   OT comments  Pt progressing towards acute OT goals. Focus of session was education on AE for LB ADLs and completing AAROM of Lt shoulder. Pt declined practicing transfers/mobility. Session details below. D/c plan remains appropriate.   Follow Up Recommendations  Home health OT    Equipment Recommendations  None recommended by OT    Recommendations for Other Services      Precautions / Restrictions Precautions Precautions: Fall Precaution Comments: pt with hx of L rotator cuff tear Restrictions Weight Bearing Restrictions: Yes LUE Weight Bearing: Weight bearing as tolerated LLE Weight Bearing: Weight bearing as tolerated       Mobility Bed Mobility Overal bed mobility: Modified Independent             General bed mobility comments: increased time with HOB elevated  Transfers Overall transfer level: Needs assistance   Transfers: Sit to/from Stand Sit to Stand: Supervision         General transfer comment: supervision for safety and lines, pt maintains weight through RLE for transfers    Balance                                   ADL Overall ADL's : Needs assistance/impaired                                       General ADL Comments: Educated and demonstrated AE for LB ADLs. Pt completed 5 AAROM  left shoulder flexion with cues needed for techniques. Declined mobility citing pain and that he was worn out from working with PT (an hour ago). Reviewed ADL techniques for LUE and LLE deficits.       Vision                     Perception     Praxis      Cognition   Behavior During Therapy: Flat affect Overall Cognitive Status: Within Functional Limits for tasks assessed                       Extremity/Trunk Assessment               Exercises General Exercises - Lower Extremity Quad Sets: AROM;Left;10 reps;Seated Long Arc Quad: AAROM;Seated;Left;10 reps Heel Slides: AAROM;Seated;Left;10 reps Hip Flexion/Marching: AAROM;Seated;Left;10 reps Other Exercises Other Exercises: AAROM/self ROM L shoulder in supine, 5 reps    Shoulder Instructions       General Comments      Pertinent Vitals/ Pain       Pain Assessment: 0-10 Pain Score: 6  Pain Location: left knee Pain Descriptors / Indicators: Aching;Sore Pain Intervention(s): Limited activity within patient's tolerance;Monitored during session  Home Living  Prior Functioning/Environment              Frequency Min 2X/week     Progress Toward Goals  OT Goals(current goals can now be found in the care plan section)  Progress towards OT goals: Progressing toward goals  Acute Rehab OT Goals Patient Stated Goal: be able to walk and go home OT Goal Formulation: With patient Time For Goal Achievement: 09/05/15 Potential to Achieve Goals: Good ADL Goals Pt Will Perform Grooming: with supervision;standing Pt Will Perform Upper Body Bathing: with supervision;sitting Pt Will Perform Lower Body Bathing: with supervision;with adaptive equipment;sit to/from stand Pt Will Perform Upper Body Dressing: with supervision;sitting Pt Will Perform Lower Body Dressing: with supervision;with adaptive equipment;sit to/from stand Pt Will Transfer to  Toilet: with supervision;ambulating;bedside commode Pt Will Perform Toileting - Clothing Manipulation and hygiene: with supervision;sit to/from stand Pt Will Perform Tub/Shower Transfer: with supervision;ambulating;3 in 1 Pt/caregiver will Perform Home Exercise Program: Left upper extremity;Independently  Plan Discharge plan remains appropriate    Co-evaluation                 End of Session CPM Left Knee CPM Left Knee: Off   Activity Tolerance     Patient Left in chair;with call bell/phone within reach;with family/visitor present   Nurse Communication          Time: [id number] OT Time Calculation (min): 12 min  Charges: OT Treatments $Self Care/Home Management : 8-22 mins  Pilar GrammesMathews, Caraline Deutschman H 08/30/2015, 12:49 PM

## 2015-08-30 NOTE — Progress Notes (Signed)
Pt. Is discharging home with single lumen PICC line in place to right upper arm. No s/s of infections noted to site. IV team flushed and capped PICC line. Pt. Will receive IV antibiotic therapy at home with [facility].

## 2015-08-30 NOTE — Discharge Summary (Signed)
PATIENT ID:      Raymond Peterson  MRN:     [id number] DOB/AGE:    1971-10-03 / 44 y.o.     DISCHARGE SUMMARY  ADMISSION DATE:    08/24/2015 DISCHARGE DATE:    ADMISSION DIAGNOSIS: bacteremia Past Medical History  Diagnosis Date  . PONV (postoperative nausea and vomiting)     "just once"  . Arthritis     oa  kness & shoulders     DISCHARGE DIAGNOSIS:   Active Problems:   Septic joint of left knee joint (HCC)   Venous thromboembolism (VTE)   Abnormal LFTs   DVT of leg (deep venous thrombosis) (HCC)   Elevated liver enzymes   PROCEDURE: Procedure(s): TRANSESOPHAGEAL ECHOCARDIOGRAM (TEE) on 08/24/2015 - 08/26/2015  CONSULTS:   Hospitalist and Infectious disease  HISTORY:  See H&P in chart.  HOSPITAL COURSE:  Raymond Peterson is a 44 y.o. admitted on 08/24/2015   for evaluation and ongoing inpatient treatment to include repeat arthroscopic lavage of his left knee. 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 Infectious disease was consulted at that time and he was started on four weeks of IV Ancef. He was discharged following surgery and has been home with worsening swelling and poor mobility and was seen on Sunday and diagnosed with DVT and started on xarelto. He was seen today in our office where he looked and felt ill and repeat aspiration showed ongoing purulence. He was felt to require repeat admission for ongoing inpatient management   , and found to have a diagnosis of bacteremia.  They were brought to the operating room on 08/24/2015 - 08/26/2015 and underwent Procedure(s):  Repeat arthroscopic lavage of left knee on 08/25/15 TRANSESOPHAGEAL ECHOCARDIOGRAM (TEE).    They were given perioperative antibiotics: Anti-infectives    Start     Dose/Rate Route Frequency Ordered Stop   08/30/15 0000  ceFAZolin (ANCEF) 2-3 GM-% SOLR    Comments:  Dosing and length per infectious  disease   2 g 100 mL/hr over 30 Minutes Intravenous Every 8 hours 08/30/15 0813 09/17/15 2359   08/25/15 1400  ceFAZolin (ANCEF) IVPB 2 g/50 mL premix  Status:  Discontinued     2 g 100 mL/hr over 30 Minutes Intravenous 3 times per day 08/25/15 1308 08/25/15 1330   08/25/15 0945  ceFAZolin (ANCEF) IVPB 2 g/50 mL premix     2 g 100 mL/hr over 30 Minutes Intravenous On call to O.R. 08/24/15 1207 08/25/15 1015   08/24/15 1400  ceFAZolin (ANCEF) IVPB 2 g/50 mL premix     2 g 100 mL/hr over 30 Minutes Intravenous 3 times per day 08/24/15 1133      .  Patient underwent the above named procedure and tolerated it well. The following day they were hemodynamically stable and and further workup included a TEE which was negative. The rest of his hospitalization was geared towards increasing mobility and PT/OT was ordered and worked with patient per protocol. An Inpatient Rehab consult was ordered however they had no bed availabilty so discharge plan was changed to DC home with home health services as his mobility did improve with improved pain tolerance. On 08/30/15 he was afebrile and had ambulated short distances. His left knee was swollen but no gross effusion or signs of reaccumulating purulence and he was felt stable for DC home.   DIAGNOSTIC STUDIES:  RECENT RADIOGRAPHIC STUDIES :  Ct Angio Chest Pe W/cm &/or Wo Cm  08/19/2015  CLINICAL DATA:  Status post left knee and left shoulder surgery, with worsening pain, swelling and fever at the left leg. Diagnosed with left leg DVT. Assess for pulmonary embolus. Initial encounter. EXAM: CT ANGIOGRAPHY CHEST WITH CONTRAST TECHNIQUE: Multidetector CT imaging of the chest was performed using the standard protocol during bolus administration of intravenous contrast. Multiplanar CT image reconstructions and MIPs were obtained to evaluate the vascular anatomy. CONTRAST:  100mL OMNIPAQUE IOHEXOL 350 MG/ML SOLN COMPARISON:  Chest radiograph performed 08/12/2015  FINDINGS: There is no evidence of central pulmonary embolus. However, there are a number of potential vague filling defects in the periphery of the pulmonary arteries bilaterally. This may simply reflect motion artifact or beam hardening artifact, though segmental pulmonary embolus cannot be excluded on the basis of this study. Minimal left-sided atelectasis is noted. The lungs are otherwise clear. There is no evidence of significant focal consolidation, pleural effusion or pneumothorax. No masses are identified; no abnormal focal contrast enhancement is seen. The mediastinum is unremarkable in appearance. No mediastinal lymphadenopathy is seen. No pericardial effusion is identified. The great vessels are grossly unremarkable in appearance. No axillary lymphadenopathy is seen. The visualized portions of the thyroid gland are unremarkable in appearance. The visualized portions of the liver and spleen are unremarkable. No acute osseous abnormalities are seen. Review of the MIP images confirms the above findings. IMPRESSION: 1. No evidence of central pulmonary embolus. However, there are number of potential vague filling defects in the periphery of the pulmonary arteries bilaterally; these may simply be artifactual, though segmental pulmonary embolus cannot be excluded on the basis of this study. Depending on the patient's symptoms, a repeat CTA of the chest could be considered. Note that a new peripheral IV catheter would be required; the patient's existing power PICC limited the rate of injection of contrast on this study, corresponding to the suboptimal results. 2. Minimal left-sided atelectasis noted.  Lungs otherwise clear. Electronically Signed   By: Raymond RaiderJeffery  Chang M.D.   On: 08/19/2015 18:33   Dg Chest Port 1 View  08/12/2015  CLINICAL DATA:  Right PICC line insertion, left knee infection, access for long-term antibiotics EXAM: PORTABLE CHEST 1 VIEW COMPARISON:  None. FINDINGS: Right PICC line mid SVC level.  Mild cardiac enlargement and prominent hilar vascularity. Minimal basilar atelectasis. No focal pneumonia, collapse or consolidation. No edema, effusion or pneumothorax. Trachea midline. No acute osseous finding. Monitor leads overlie the chest. IMPRESSION: Right PICC line tip mid SVC level. Cardiomegaly without CHF Basilar atelectasis Electronically Signed   By: Raymond PetitM.  Shick M.D.   On: 08/12/2015 08:56   [place] Abdomen Limited Ruq  08/26/2015  CLINICAL DATA:  [age] year old male with abnormal liver enzymes. Recent knee and shoulder surgery with fever and pain. Initial encounter. EXAM: US ABDOMEN LIMITED - RIGHT UPPER QUADRANT COMPARISON:  Chest CTA 08/19/2015 FINDINGS: Gallbladder: Subtotally filled with echogenic sludge. No shadowing echogenic stones identified. Gallbladder wall thickness remains normal. No pericholecystic fluid. No sonographic Murphy sign elicited. Common bile duct: Diameter: 4 mm, normal Liver: Echogenicity within normal limits. No discrete liver lesion or intrahepatic biliary ductal dilatation identified. Other findings: Negative visible right kidney. IMPRESSION: 1. Gallbladder is filled with sludge. 2. No evidence of acute cholecystitis or acute biliary obstruction. Electronically Signed   By: Raymond FlemingH  Hall M.D.   On: 08/26/2015 09:51    RECENT VITAL SIGNS:  Patient Vitals for the past 24 hrs:  BP Temp Temp src Pulse Resp  SpO2  08/30/15 0450 121/76 mmHg 98.9 F (37.2 C) Oral 93 18 98 %  08/29/15 2040 125/65 mmHg 98.5 F (36.9 C) Oral (!) 110 18 96 %  08/29/15 1233 127/81 mmHg 98.6 F (37 C) - 97 16 98 %  .  RECENT EKG RESULTS:   No orders found for this or any previous visit.  DISCHARGE INSTRUCTIONS:  Discharge Instructions    Ambulatory referral to Home Health    Complete by:  As directed   Please evaluate Raymond Peterson for admission to [facility].  Disciplines requested: Physical Therapy and Nursing  Services to provide: IV Antibiotics  Physician to follow patient's care (the  person listed here will be responsible for signing ongoing orders): Other: Dr. Rennis Chris for knee and infectious disease for antibiotics  Requested Start of Care Date: Tomorrow  I certify that this patient is under my care and that I, or a Nurse Practitioner or Physician's Assistant working with me, had a face-to-face encounter that meets the physician face-to-face requirements with patient on 11/222/16. The encounter with the patient was in whole, or in part for the following medical condition(s) which is the primary reason for home health care (List medical condition). Septic left knee and shoulder  Special Instructions:  PT and RN ROM and strengthening to left knee and left shoulder No restrictions in weight bearing and may be active and passive ROM for both  Does the patient have Medicare or Medicaid?:  No  The encounter with the patient was in whole, or in part, for the following medical condition, which is the primary reason for home health care:  yes  Reason for Medically Necessary Home Health Services:  Skilled Nursing- Assessment and Training for Infusion Therapy, Line Care, and Infection Control  My clinical findings support the need for the above services:  Unable to leave home safely without assistance and/or assistive device  I certify that, based on my findings, the following services are medically necessary home health services:   Nursing Physical therapy    Further, I certify that my clinical findings support that this patient is homebound due to:  Unable to leave home safely without assistance           DISCHARGE MEDICATIONS:     Medication List    TAKE these medications        ceFAZolin 2-3 GM-% Solr  Commonly known as:  ANCEF  Inject 50 mLs (2 g total) into the vein every 8 (eight) hours.     docusate sodium 100 MG capsule  Commonly known as:  COLACE  Take 1 capsule (100 mg total) by mouth 2 (two) times daily.     methocarbamol 500 MG tablet  Commonly known as:   ROBAXIN  Take 1 tablet (500 mg total) by mouth every 8 (eight) hours as needed for muscle spasms.     oxyCODONE 5 MG immediate release tablet  Commonly known as:  ROXICODONE  Take 1-2 tablets (5-10 mg total) by mouth every 4 (four) hours as needed for severe pain.     oxyCODONE-acetaminophen 5-325 MG tablet  Commonly known as:  PERCOCET  Take 1-2 tablets by mouth every 4 (four) hours as needed.     polyethylene glycol packet  Commonly known as:  MIRALAX / GLYCOLAX  Take 17 g by mouth daily.     Rivaroxaban 15 MG Tabs tablet  Commonly known as:  XARELTO  Take 1 tablet (15 mg total) by mouth 2 (two) times daily with  a meal.     zolpidem 10 MG tablet  Commonly known as:  AMBIEN  Take 1 tablet (10 mg total) by mouth at bedtime as needed for sleep.        FOLLOW UP VISIT:       Follow-up Information    Follow up with Raymond Righter, Raymond Peterson.   Specialty:  Infectious Diseases   Contact information:   [address] [place] [address] [phone]       Follow up with Raymond Rea SUPPLE, Raymond Peterson.   Specialty:  Orthopedic Surgery   Why:  call to be seen monday for suture removal   Contact information:   [address] [place] [address] [phone]       DISCHARGE TO: home  DISPOSITION: improved  DISCHARGE CONDITION:  Good   Raymond Peterson for Dr. Francena Hanly 08/30/2015, 8:15 AM

## 2015-09-21 ENCOUNTER — Ambulatory Visit (INDEPENDENT_AMBULATORY_CARE_PROVIDER_SITE_OTHER): Payer: BLUE CROSS/BLUE SHIELD | Admitting: Internal Medicine

## 2015-09-21 ENCOUNTER — Encounter: Payer: Self-pay | Admitting: Internal Medicine

## 2015-09-21 VITALS — BP 136/84 | HR 123 | Temp 99.4°F | Wt 218.0 lb

## 2015-09-21 DIAGNOSIS — M00062 Staphylococcal arthritis, left knee: Secondary | ICD-10-CM

## 2015-09-21 MED ORDER — CEPHALEXIN 500 MG PO CAPS: 500.0000 mg | ORAL_CAPSULE | Freq: Four times a day (QID) | ORAL | Status: DC

## 2015-09-21 NOTE — Progress Notes (Signed)
Rfv: hospital follow up on MSSA septic arthritis of left knee ,and previous left shoulder Subjective:    Patient ID: Raymond Peterson, male    DOB: 05-05-1971, 44 y.o.   MRN: [id number]  HPI  Raymond Peterson is a 44 y.o. male with with a history of work related left shoulder rotator cuff injury followed by Dr. Rennis ChrisSupple from Orthopedics. The patient had complained of worsening left shoulder pain and left knee pain (cell ct of 13,500 98%N) which was found to have MSSA septic arthritis. cx were only sent from shoulder aspirate which grew MSSA. Blood cx from 11/5 were NGTD but he was on abtx at this time. He is s/p two arthroscopic lavages on 11/3 and 11/5 for septic joints in the left glenohumeral joint and left knee. Other work up included TTE on 11/7 which was negative for murmur or vegetations. He was discharged with a PICC for 4 week course of cefazolin on 11/8. His course was complicated by left leg swelling that was c/w with Left lower leg DVT by U/S showing thrombus involving the posterior tibial and peroneal veins and he was subsequently started on xarelto.  on 11/16, still complaining of worsening left knee pain & feeling poorly. He underwent arthrocentesis in the office that was described as a purulent aspiration, with cell count of 1,425 with 93%N.  He was admitted on 11/16 in order to undergo 2nd I x D of his left knee. His WBC was 10.3 with 85% N. plt elevated at 471K. Remained afebrile. OR note from 11/17 suggests joint had ongoing infection but unclear if repeat cultures were sent.with left knee septic arthritis, incidentally found after being treated for MSSA septic left shoulder. No OR cultures were sent at that time. Endocarditis was ruled out by TEE. He was seen in ID consultation with the plan to treat for 4 wk for cefazolin for septic arthritis of left knee using 11/17 as day 1 of 28. His sed rate on November admission was 112 and crp of 25. He is here in follow up at the end of his abtx  regimen. His inflammatory markers were repeated by home health which showed good improvement and nearly normalized. Sed rate of 22 and crp 2.3  No Known Allergies Current Outpatient Prescriptions on File Prior to Visit  Medication Sig Dispense Refill  . docusate sodium (COLACE) 100 MG capsule Take 1 capsule (100 mg total) by mouth 2 (two) times daily. 10 capsule 0  . methocarbamol (ROBAXIN) 500 MG tablet Take 1 tablet (500 mg total) by mouth every 8 (eight) hours as needed for muscle spasms. 50 tablet 1  . oxyCODONE (ROXICODONE) 5 MG immediate release tablet Take 1-2 tablets (5-10 mg total) by mouth every 4 (four) hours as needed for severe pain. 50 tablet 0  . oxyCODONE-acetaminophen (PERCOCET) 5-325 MG tablet Take 1-2 tablets by mouth every 4 (four) hours as needed. 60 tablet 0  . polyethylene glycol (MIRALAX / GLYCOLAX) packet Take 17 g by mouth daily. 14 each 0  . Rivaroxaban (XARELTO) 15 MG TABS tablet Take 1 tablet (15 mg total) by mouth 2 (two) times daily with a meal. 42 tablet 0  . zolpidem (AMBIEN) 10 MG tablet Take 1 tablet (10 mg total) by mouth at bedtime as needed for sleep. (Patient taking differently: Take 10 mg by mouth at bedtime. ) 30 tablet 1   No current facility-administered medications on file prior to visit.   Active Ambulatory Problems    Diagnosis Date Noted  .  Septic joint of left knee joint (HCC) 08/11/2015  . Venous thromboembolism (VTE) 08/19/2015  . Abnormal LFTs 08/20/2015  . Hypoalbuminemia 08/20/2015  . Acute deep vein thrombosis (DVT) of proximal vein of left lower extremity (HCC)   . Severe protein-calorie malnutrition (HCC)   . DVT of leg (deep venous thrombosis) (HCC) 08/24/2015  . Elevated liver enzymes    Resolved Ambulatory Problems    Diagnosis Date Noted  . No Resolved Ambulatory Problems   Past Medical History  Diagnosis Date  . PONV (postoperative nausea and vomiting)   . Arthritis     Lab Results  Component Value Date   ESRSEDRATE  112* 08/12/2015   Lab Results  Component Value Date   CRP 25.2* 08/12/2015   12/12, sed rate of 22 and crp 2.3   Review of Systems  Constitutional: Negative for fever, chills, diaphoresis, activity change, appetite change, fatigue and unexpected weight change.  HENT: Negative for congestion, sore throat, rhinorrhea, sneezing, trouble swallowing and sinus pressure.  Eyes: Negative for photophobia and visual disturbance.  Respiratory: Negative for cough, chest tightness, shortness of breath, wheezing and stridor.  Cardiovascular: Negative for chest pain, palpitations and leg swelling.  Gastrointestinal: Negative for nausea, vomiting, abdominal pain, diarrhea, constipation, blood in stool, abdominal distention and anal bleeding.  Genitourinary: Negative for dysuria, hematuria, flank pain and difficulty urinating.  Musculoskeletal: left shoulder pain and left knee pain Skin: Negative for color change, pallor, rash and wound.  Neurological: Negative for dizziness, tremors, weakness and light-headedness.  Hematological: Negative for adenopathy. Does not bruise/bleed easily.  Psychiatric/Behavioral: Negative for behavioral problems, confusion, sleep disturbance, dysphoric mood, decreased concentration and agitation.       Objective:   Physical Exam BP 136/84 mmHg  Pulse 123  Temp(Src) 99.4 F (37.4 C) (Oral)  Wt 218 lb (98.884 kg) Physical Exam  Constitutional: He is oriented to person, place, and time. He appears well-developed and well-nourished. No distress.  HENT:  Mouth/Throat: Oropharynx is clear and moist. No oropharyngeal exudate.  Cardiovascular: Normal rate, regular rhythm and normal heart sounds. Exam reveals no gallop and no friction rub.  No murmur heard.  Pulmonary/Chest: Effort normal and breath sounds normal. No respiratory distress. He has no wheezes.   Lymphadenopathy:  He has no cervical adenopathy.  Ext: knee knee still asymmetric. Swollen in comparison to  right, slight warm to touch. No erythema. Tender to palpation, no effusion Skin: Skin is warm and dry. No rash noted. No erythema.  Psychiatric: He has a normal mood and affect. His behavior is normal.        Assessment & Plan:  Septic arthritis = can have home health pull picc line since IV abtx x 4 wk is completed. His inflammatory markers are nearly normalized. Will give additional 14 day of cephalexin. Patient to see dr supple on the 28th for reassessment.  rtc as needed

## 2015-09-22 ENCOUNTER — Telehealth: Payer: Self-pay | Admitting: *Deleted

## 2015-09-22 NOTE — Telephone Encounter (Signed)
Office note faxed to case worker at [facility], Dawn Burton [phone]. Confirmation received Wendall MolaJacqueline Talib Headley

## 2015-09-22 NOTE — Telephone Encounter (Signed)
Verbal order given to Debbie at [facility] to pull patient's picc line per Dr. Drue SecondSnider. Wendall MolaJacqueline Cockerham

## 2015-10-12 ENCOUNTER — Encounter: Payer: Self-pay | Admitting: Internal Medicine

## 2015-10-13 ENCOUNTER — Other Ambulatory Visit: Payer: Self-pay | Admitting: Internal Medicine

## 2015-10-13 NOTE — Telephone Encounter (Signed)
He needs one more month of this medication.  Given that I am not sure if he has a PCP and/or has access to this medication, will refill 1 month supply to ensure that he gets appropriate treatment for his DVT.  No further refills from our clinic unless he establishes care here.    Debe CoderMULLEN, Ulysees Robarts, MD

## 2015-12-22 ENCOUNTER — Encounter: Payer: Self-pay | Admitting: Internal Medicine

## 2015-12-29 IMAGING — US US ABDOMEN LIMITED
1 series · 14 of 25 positions shown · non-contrast
Comparison: Chest CTA 08/19/2015

CLINICAL DATA: 44-year-old male with abnormal liver enzymes. Recent
knee and shoulder surgery with fever and pain. Initial encounter.

EXAM:
US ABDOMEN LIMITED - RIGHT UPPER QUADRANT

[Series 1: us abdomen limited · 0.30mm/px · 14 of 46 slices shown]
[im 1/46]
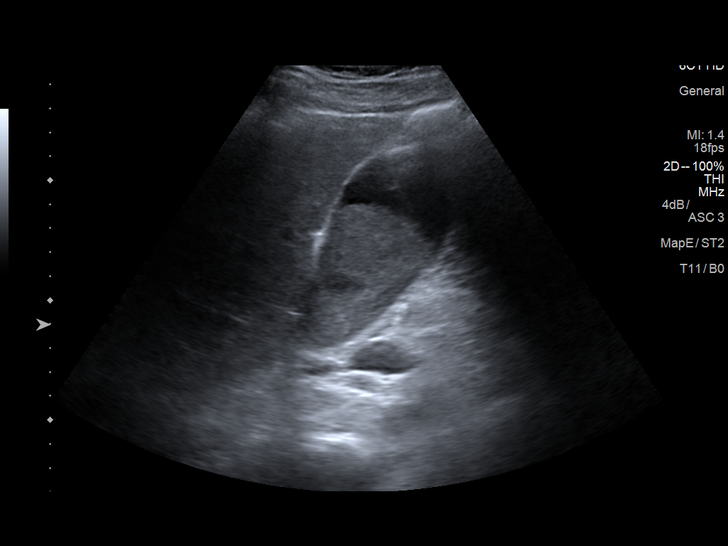
[im 4/46]
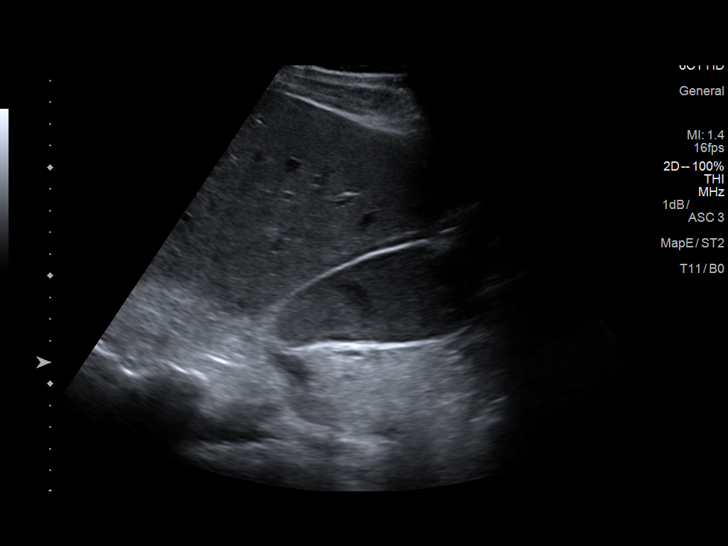
[im 8/46]
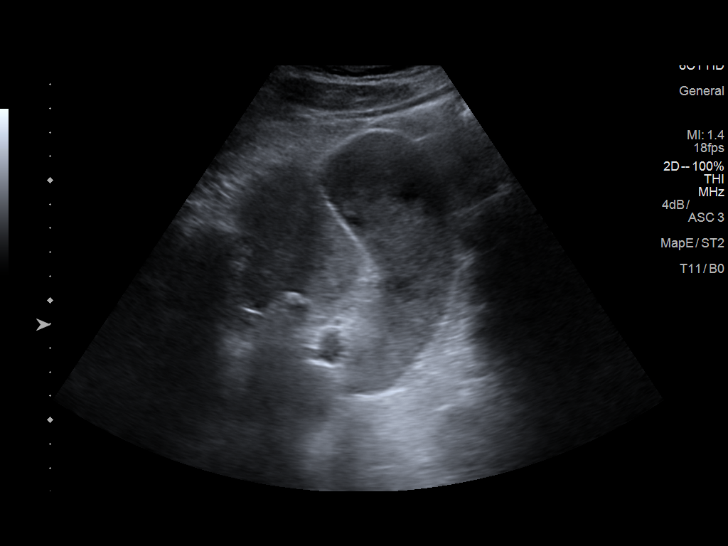
[im 12/46]
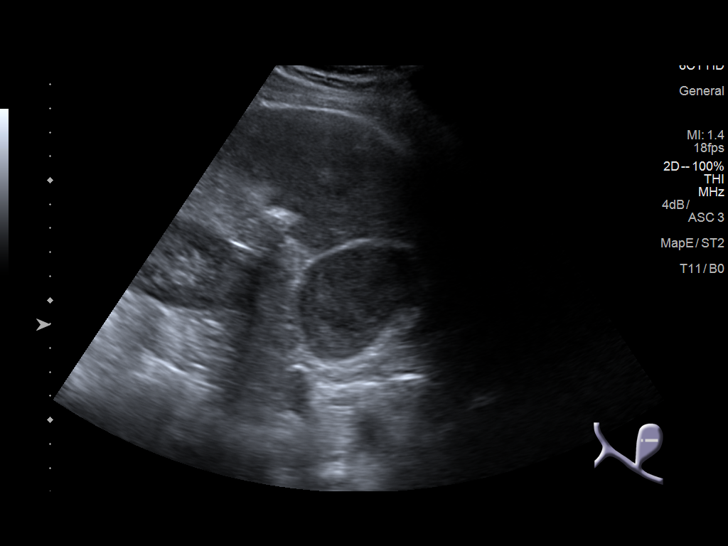
[im 16/46]
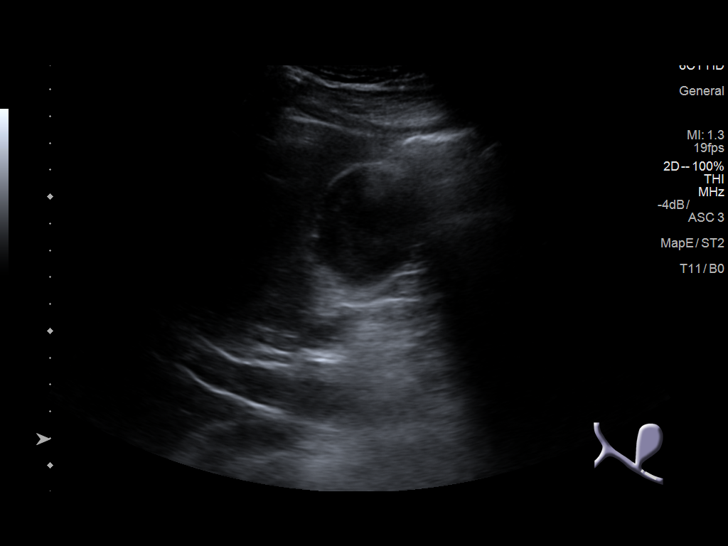
[im 17/46]
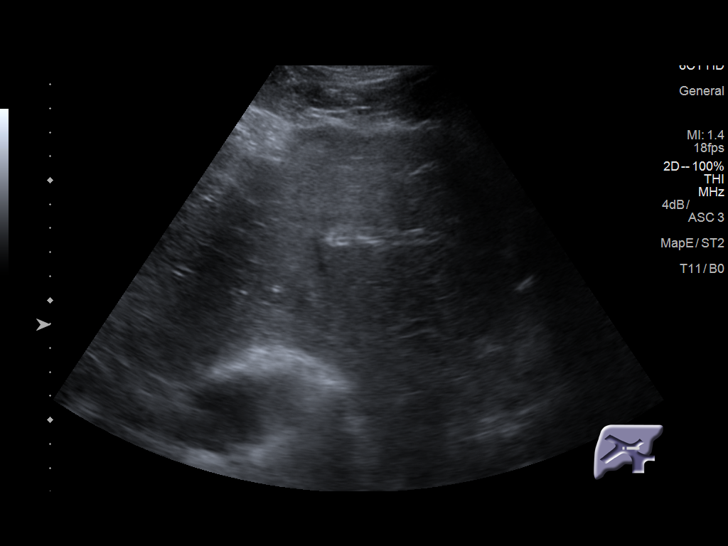
[im 21/46]
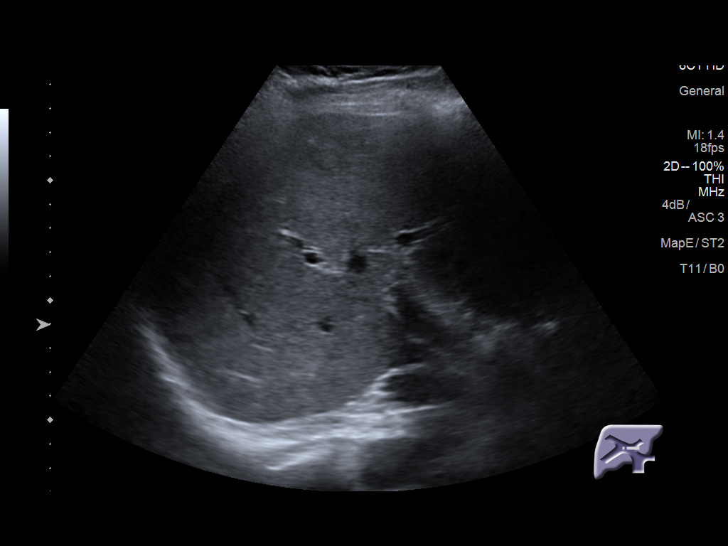
[im 25/46]
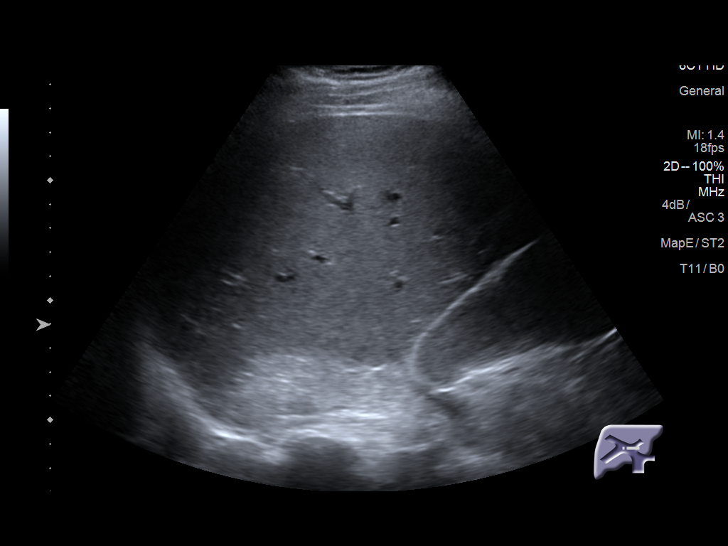
[im 29/46]
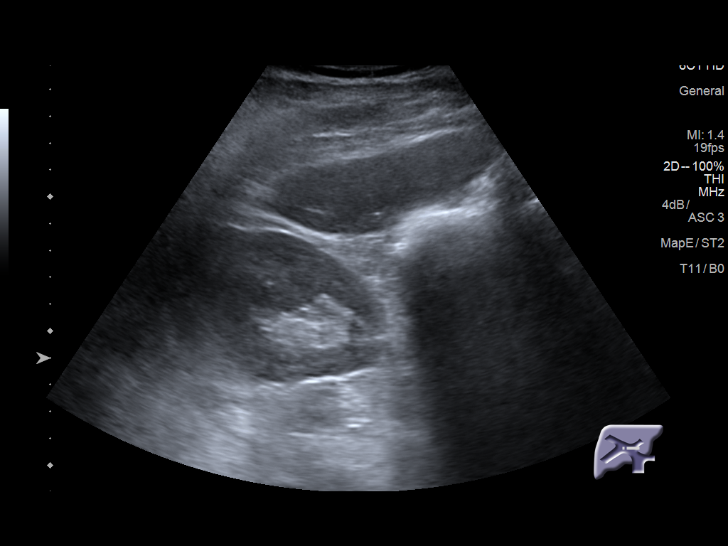
[im 31/46]
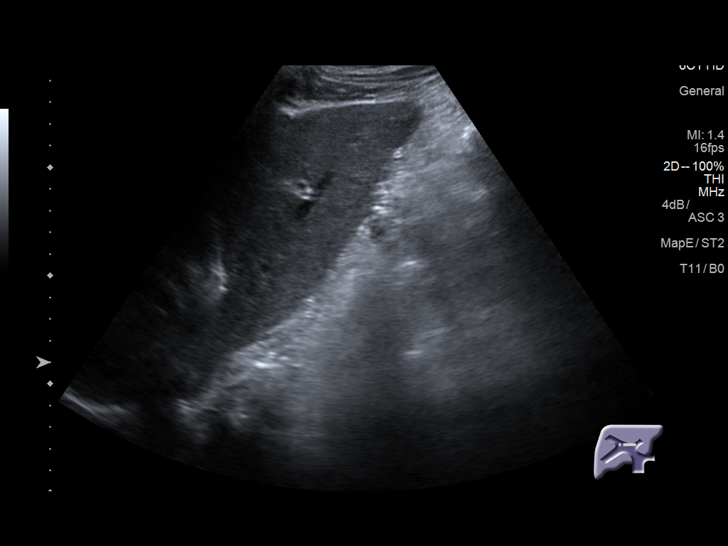
[im 34/46]
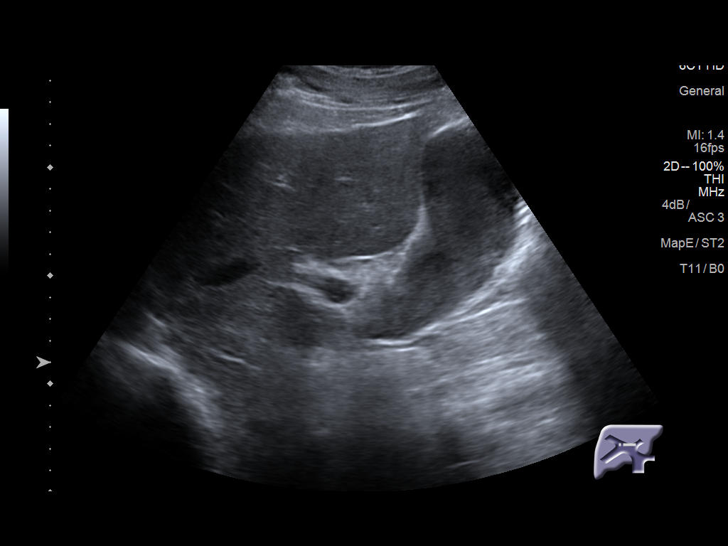
[im 38/46]
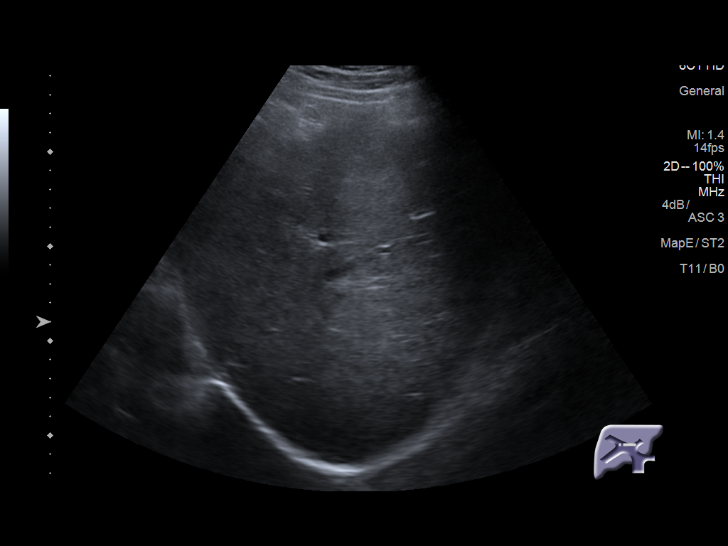
[im 42/46]
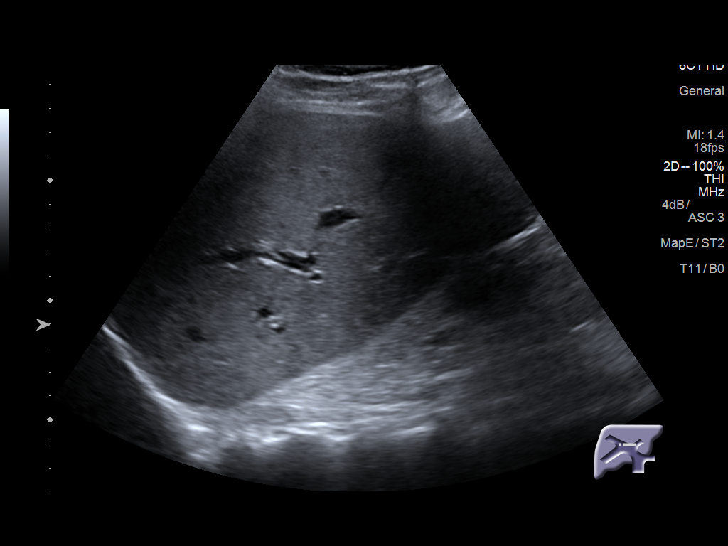
[im 46/46]
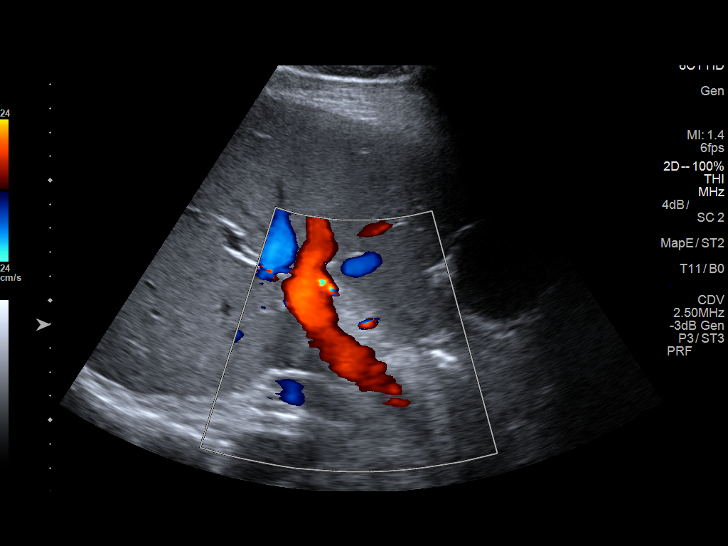

[14 of 25 positions shown; findings below may reference images not displayed]

FINDINGS: Gallbladder:

Subtotally filled with echogenic sludge. No shadowing echogenic
stones identified. Gallbladder wall thickness remains normal. No
pericholecystic fluid. No sonographic Murphy sign elicited.

Common bile duct:

Diameter: 4 mm, normal

Liver:

Echogenicity within normal limits. No discrete liver lesion or
intrahepatic biliary ductal dilatation identified.

Other findings: Negative visible right kidney.
IMPRESSION: 1. Gallbladder is filled with sludge.
2. No evidence of acute cholecystitis or acute biliary obstruction.

## 2016-01-19 NOTE — Patient Instructions (Addendum)
Raymond Peterson  01/19/2016   Your procedure is scheduled on: January 30, 2016  Report to [facility] Main  Entrance take [facility]  elevators to 3rd floor to  Short Stay Center at 10:30 AM.  Call this number if you have problems the morning of surgery [phone]   Remember: ONLY 1 PERSON MAY GO WITH YOU TO SHORT STAY TO GET  READY MORNING OF YOUR SURGERY.  Do not eat food after midnight Sunday night. Follow clear liquid diet after midnight Sunday until 6:00 AM Monday morning.  Then nothing by mouth.      Take these medicines the morning of surgery with A SIP OF WATER: None DO NOT TAKE ANY DIABETIC MEDICATIONS DAY OF YOUR SURGERY                               You may not have any metal on your body including hair pins and              piercings  Do not wear jewelry, lotions, powders or perfumes, deodorant               Men may shave face and neck.   Do not bring valuables to the hospital. [facility] IS NOT             RESPONSIBLE   FOR VALUABLES.  Contacts, dentures or bridgework may not be worn into surgery.  Leave suitcase in the car. After surgery it may be brought to your room.        Special Instructions: coughing and deep breathing exercises, leg exercises             Please read over the following fact sheets you were given: _____________________________________________________________________             [facility] - Preparing for Surgery Before surgery, you can play an important role.  Because skin is not sterile, your skin needs to be as free of germs as possible.  You can reduce the number of germs on your skin by washing with CHG (chlorahexidine gluconate) soap before surgery.  CHG is an antiseptic cleaner which kills germs and bonds with the skin to continue killing germs even after washing. Please DO NOT use if you have an allergy to CHG or antibacterial soaps.  If your skin becomes reddened/irritated stop using the CHG and inform your nurse  when you arrive at Short Stay. Do not shave (including legs and underarms) for at least 48 hours prior to the first CHG shower.  You may shave your face/neck. Please follow these instructions carefully:  1.  Shower with CHG Soap the night before surgery and the  morning of Surgery.  2.  If you choose to wash your hair, wash your hair first as usual with your  normal  shampoo.  3.  After you shampoo, rinse your hair and body thoroughly to remove the  shampoo.                           4.  Use CHG as you would any other liquid soap.  You can apply chg directly  to the skin and wash                       Gently  with a scrungie or clean washcloth.  5.  Apply the CHG Soap to your body ONLY FROM THE NECK DOWN.   Do not use on face/ open                           Wound or open sores. Avoid contact with eyes, ears mouth and genitals (private parts).                       Wash face,  Genitals (private parts) with your normal soap.             6.  Wash thoroughly, paying special attention to the area where your surgery  will be performed.  7.  Thoroughly rinse your body with warm water from the neck down.  8.  DO NOT shower/wash with your normal soap after using and rinsing off  the CHG Soap.                9.  Pat yourself dry with a clean towel.            10.  Wear clean pajamas.            11.  Place clean sheets on your bed the night of your first shower and do not  sleep with pets. Day of Surgery : Do not apply any lotions/deodorants the morning of surgery.  Please wear clean clothes to the hospital/surgery center.  FAILURE TO FOLLOW THESE INSTRUCTIONS MAY RESULT IN THE CANCELLATION OF YOUR SURGERY PATIENT SIGNATURE_________________________________  NURSE SIGNATURE__________________________________  ________________________________________________________________________    CLEAR LIQUID DIET   Foods Allowed                                                                     Foods  Excluded  Coffee and tea, regular and decaf                             liquids that you cannot  Plain Jell-O in any flavor                                             see through such as: Fruit ices (not with fruit pulp)                                     milk, soups, orange juice  Iced Popsicles                                    All solid food Carbonated beverages, regular and diet                                    Cranberry, grape and apple juices Sports drinks like Gatorade Lightly seasoned clear broth or consume(fat free) Sugar, honey syrup  Sample  Menu Breakfast                                Lunch                                     Supper Cranberry juice                    Beef broth                            Chicken broth Jell-O                                     Grape juice                           Apple juice Coffee or tea                        Jell-O                                      Popsicle                                                Coffee or tea                        Coffee or tea  _____________________________________________________________________   WHAT IS A BLOOD TRANSFUSION? Blood Transfusion Information  A transfusion is the replacement of blood or some of its parts. Blood is made up of multiple cells which provide different functions.  Red blood cells carry oxygen and are used for blood loss replacement.  White blood cells fight against infection.  Platelets control bleeding.  Plasma helps clot blood.  Other blood products are available for specialized needs, such as hemophilia or other clotting disorders. BEFORE THE TRANSFUSION  Who gives blood for transfusions?   Healthy volunteers who are fully evaluated to make sure their blood is safe. This is blood bank blood. Transfusion therapy is the safest it has ever been in the practice of medicine. Before blood is taken from a donor, a complete history is taken to make sure that person has no  history of diseases nor engages in risky social behavior (examples are intravenous drug use or sexual activity with multiple partners). The donor's travel history is screened to minimize risk of transmitting infections, such as malaria. The donated blood is tested for signs of infectious diseases, such as HIV and hepatitis. The blood is then tested to be sure it is compatible with you in order to minimize the chance of a transfusion reaction. If you or a relative donates blood, this is often done in anticipation of surgery and is not appropriate for emergency situations. It takes many days to process the donated blood. RISKS AND COMPLICATIONS Although transfusion therapy is very safe and saves many lives, the main dangers of transfusion include:  1.  Getting an infectious disease. 2. Developing a transfusion reaction. This is an allergic reaction to something in the blood you were given. Every precaution is taken to prevent this. The decision to have a blood transfusion has been considered carefully by your caregiver before blood is given. Blood is not given unless the benefits outweigh the risks. AFTER THE TRANSFUSION  Right after receiving a blood transfusion, you will usually feel much better and more energetic. This is especially true if your red blood cells have gotten low (anemic). The transfusion raises the level of the red blood cells which carry oxygen, and this usually causes an energy increase.  The nurse administering the transfusion will monitor you carefully for complications. HOME CARE INSTRUCTIONS  No special instructions are needed after a transfusion. You may find your energy is better. Speak with your caregiver about any limitations on activity for underlying diseases you may have. SEEK MEDICAL CARE IF:   Your condition is not improving after your transfusion.  You develop redness or irritation at the intravenous (IV) site. SEEK IMMEDIATE MEDICAL CARE IF:  Any of the following  symptoms occur over the next 12 hours:  Shaking chills.  You have a temperature by mouth above 102 F (38.9 C), not controlled by medicine.  Chest, back, or muscle pain.  People around you feel you are not acting correctly or are confused.  Shortness of breath or difficulty breathing.  Dizziness and fainting.  You get a rash or develop hives.  You have a decrease in urine output.  Your urine turns a dark color or changes to pink, red, or brown. Any of the following symptoms occur over the next 10 days:  You have a temperature by mouth above 102 F (38.9 C), not controlled by medicine.  Shortness of breath.  Weakness after normal activity.  The white part of the eye turns yellow (jaundice).  You have a decrease in the amount of urine or are urinating less often.  Your urine turns a dark color or changes to pink, red, or brown. Document Released: 09/21/2000 Document Revised: 12/17/2011 Document Reviewed: 05/10/2008 [employer] Patient Information 2014 [place].  _______________________________________________________________________  Incentive Spirometer  An incentive spirometer is a tool that can help keep your lungs clear and active. This tool measures how well you are filling your lungs with each breath. Taking long deep breaths may help reverse or decrease the chance of developing breathing (pulmonary) problems (especially infection) following:  A long period of time when you are unable to move or be active. BEFORE THE PROCEDURE   If the spirometer includes an indicator to show your best effort, your nurse or respiratory therapist will set it to a desired goal.  If possible, sit up straight or lean slightly forward. Try not to slouch.  Hold the incentive spirometer in an upright position. INSTRUCTIONS FOR USE  3. Sit on the edge of your bed if possible, or sit up as far as you can in bed or on a chair. 4. Hold the incentive spirometer in an upright  position. 5. Breathe out normally. 6. Place the mouthpiece in your mouth and seal your lips tightly around it. 7. Breathe in slowly and as deeply as possible, raising the piston or the ball toward the top of the column. 8. Hold your breath for 3-5 seconds or for as long as possible. Allow the piston or ball to fall to the bottom of the column. 9. Remove the mouthpiece from your mouth and breathe out normally.  10. Rest for a few seconds and repeat Steps 1 through 7 at least 10 times every 1-2 hours when you are awake. Take your time and take a few normal breaths between deep breaths. 11. The spirometer may include an indicator to show your best effort. Use the indicator as a goal to work toward during each repetition. 12. After each set of 10 deep breaths, practice coughing to be sure your lungs are clear. If you have an incision (the cut made at the time of surgery), support your incision when coughing by placing a pillow or rolled up towels firmly against it. Once you are able to get out of bed, walk around indoors and cough well. You may stop using the incentive spirometer when instructed by your caregiver.  RISKS AND COMPLICATIONS  Take your time so you do not get dizzy or light-headed.  If you are in pain, you may need to take or ask for pain medication before doing incentive spirometry. It is harder to take a deep breath if you are having pain. AFTER USE  Rest and breathe slowly and easily.  It can be helpful to keep track of a log of your progress. Your caregiver can provide you with a simple table to help with this. If you are using the spirometer at home, follow these instructions: SEEK MEDICAL CARE IF:   You are having difficultly using the spirometer.  You have trouble using the spirometer as often as instructed.  Your pain medication is not giving enough relief while using the spirometer.  You develop fever of 100.5 F (38.1 C) or higher. SEEK IMMEDIATE MEDICAL CARE IF:    You cough up bloody sputum that had not been present before.  You develop fever of 102 F (38.9 C) or greater.  You develop worsening pain at or near the incision site. MAKE SURE YOU:   Understand these instructions.  Will watch your condition.  Will get help right away if you are not doing well or get worse. Document Released: 02/04/2007 Document Revised: 12/17/2011 Document Reviewed: 04/07/2007 [facility] Patient Information 2014 [place], [place].   ________________________________________________________________________

## 2016-01-23 ENCOUNTER — Encounter (HOSPITAL_COMMUNITY)
Admission: RE | Admit: 2016-01-23 | Discharge: 2016-01-23 | Disposition: A | Discharge status: Home / Self Care | Payer: BLUE CROSS/BLUE SHIELD | Source: Ambulatory Visit

## 2016-01-30 ENCOUNTER — Inpatient Hospital Stay (HOSPITAL_COMMUNITY)
Admission: RE | Admit: 2016-01-30 | Payer: BLUE CROSS/BLUE SHIELD | Source: Ambulatory Visit | Admitting: Orthopedic Surgery

## 2016-01-30 ENCOUNTER — Encounter (HOSPITAL_COMMUNITY): Admission: RE | Payer: Self-pay | Source: Ambulatory Visit

## 2016-01-30 SURGERY — ARTHROPLASTY, KNEE, TOTAL
Anesthesia: Spinal | Site: Knee | Laterality: Left

## 2016-02-20 ENCOUNTER — Encounter (HOSPITAL_COMMUNITY): Payer: Self-pay

## 2016-02-20 ENCOUNTER — Encounter (HOSPITAL_COMMUNITY)
Admission: RE | Admit: 2016-02-20 | Discharge: 2016-02-20 | Disposition: A | Discharge status: Home / Self Care | Payer: BLUE CROSS/BLUE SHIELD | Source: Ambulatory Visit | Attending: Orthopedic Surgery | Admitting: Orthopedic Surgery

## 2016-02-20 DIAGNOSIS — M1712 Unilateral primary osteoarthritis, left knee: Secondary | ICD-10-CM | POA: Diagnosis not present

## 2016-02-20 DIAGNOSIS — Z01812 Encounter for preprocedural laboratory examination: Secondary | ICD-10-CM | POA: Insufficient documentation

## 2016-02-20 DIAGNOSIS — M25562 Pain in left knee: Secondary | ICD-10-CM | POA: Insufficient documentation

## 2016-02-20 HISTORY — DX: Acute embolism and thrombosis of unspecified deep veins of unspecified lower extremity: I82.409

## 2016-02-20 LAB — CBC
HCT: 46.6 % (ref 39.0–52.0)
Hemoglobin: 16.4 g/dL (ref 13.0–17.0)
MCH: 31.8 pg (ref 26.0–34.0)
MCHC: 35.2 g/dL (ref 30.0–36.0)
MCV: 90.5 fL (ref 78.0–100.0)
PLATELETS: 243 10*3/uL (ref 150–400)
RBC: 5.15 MIL/uL (ref 4.22–5.81)
RDW: 13.3 % (ref 11.5–15.5)
WBC: 4.8 10*3/uL (ref 4.0–10.5)

## 2016-02-20 LAB — URINE MICROSCOPIC-ADD ON: RBC / HPF: NONE SEEN RBC/hpf (ref 0–5)

## 2016-02-20 LAB — PROTIME-INR
INR: 1.04 (ref 0.00–1.49)
PROTHROMBIN TIME: 13.8 s (ref 11.6–15.2)

## 2016-02-20 LAB — URINALYSIS, ROUTINE W REFLEX MICROSCOPIC
BILIRUBIN URINE: NEGATIVE
GLUCOSE, UA: NEGATIVE mg/dL
HGB URINE DIPSTICK: NEGATIVE
KETONES UR: NEGATIVE mg/dL
Leukocytes, UA: NEGATIVE
Nitrite: NEGATIVE
PROTEIN: NEGATIVE mg/dL
Specific Gravity, Urine: 1.036 — ABNORMAL HIGH (ref 1.005–1.030)
pH: 5 (ref 5.0–8.0)

## 2016-02-20 LAB — ABO/RH: ABO/RH(D): O POS

## 2016-02-20 LAB — APTT: APTT: 31 s (ref 24–37)

## 2016-02-20 LAB — BASIC METABOLIC PANEL
Anion gap: 8 (ref 5–15)
BUN: 15 mg/dL (ref 6–20)
CALCIUM: 9.5 mg/dL (ref 8.9–10.3)
CO2: 26 mmol/L (ref 22–32)
CREATININE: 1 mg/dL (ref 0.61–1.24)
Chloride: 107 mmol/L (ref 101–111)
Glucose, Bld: 98 mg/dL (ref 65–99)
Potassium: 3.8 mmol/L (ref 3.5–5.1)
SODIUM: 141 mmol/L (ref 135–145)

## 2016-02-20 LAB — SURGICAL PCR SCREEN
MRSA, PCR: NEGATIVE
Staphylococcus aureus: NEGATIVE

## 2016-02-20 NOTE — Patient Instructions (Signed)
Raymond CraverJeremy D Peterson  02/20/2016   Your procedure is scheduled JY:NWGNFAon:Monday 02/27/2016   [facility]  [place]tr[place]  elevators to 3rd floor to  Short Stay Center at  1200 PM.  Call this number if you have problems the morning of surgery [phone]   Remember: ONLY 1 PERSON MAY GO WITH YOU TO SHORT STAY TO GET  READY MORNING OF YOUR SURGERY.   Do not eat food  :After Midnight.MAY HAVE CLEAR LIQUIDS FROM MIDNIGHT UP UNTIL 0900 AM THEN NOTHING UNTIL AFTER SURGERY!    Take these medicines the morning of surgery with A SIP OF WATER: HYDROCODONE-ACETAMINOPHEN IF NEEDED                                You may not have any metal on your body including hair pins and              piercings  Do not wear jewelry, make-up, lotions, powders or perfumes, deodorant             Do not wear nail polish.  Do not shave  48 hours prior to surgery.              Men may shave face and neck.   Do not bring valuables to the hospital. [facility] IS NOT             RESPONSIBLE   FOR VALUABLES.  Contacts, dentures or bridgework may not be worn into surgery.  Leave suitcase in the car. After surgery it may be brought to your room.                   Please read over the following fact sheets you were given: _____________________________________________________________________             [name] Before surgery, you can play an important role.  Because skin is not sterile, your skin needs to be as free of germs as possible.  You can reduce the number of germs on your skin by washing with CHG (chlorahexidine gluconate) soap before surgery.  CHG is an antiseptic cleaner which kills germs and bonds with the skin to continue killing germs even after washing. Please DO NOT use if you have an allergy to CHG or antibacterial soaps.  If your skin becomes reddened/irritated stop using the CHG and inform your nurse when you arrive at Short Stay. Do not  shave (including legs and underarms) for at least 48 hours prior to the first CHG shower.  You may shave your face/neck. Please follow these instructions carefully:  1.  Shower with CHG Soap the night before surgery and the  morning of Surgery.  2.  If you choose to wash your hair, wash your hair first as usual with your  normal  shampoo.  3.  After you shampoo, rinse your hair and body thoroughly to remove the  shampoo.                           4.  Use CHG as you would any other liquid soap.  You can apply chg directly  to the skin and wash  Gently with a scrungie or clean washcloth.  5.  Apply the CHG Soap to your body ONLY FROM THE NECK DOWN.   Do not use on face/ open                           Wound or open sores. Avoid contact with eyes, ears mouth and genitals (private parts).                       Wash face,  Genitals (private parts) with your normal soap.             6.  Wash thoroughly, paying special attention to the area where your surgery  will be performed.  7.  Thoroughly rinse your body with warm water from the neck down.  8.  DO NOT shower/wash with your normal soap after using and rinsing off  the CHG Soap.                9.  Pat yourself dry with a clean towel.            10.  Wear clean pajamas.            11.  Place clean sheets on your bed the night of your first shower and do not  sleep with pets. Day of Surgery : Do not apply any lotions/deodorants the morning of surgery.  Please wear clean clothes to the hospital/surgery center.  FAILURE TO FOLLOW THESE INSTRUCTIONS MAY RESULT IN THE CANCELLATION OF YOUR SURGERY PATIENT SIGNATURE_________________________________  NURSE SIGNATURE__________________________________  ________________________________________________________________________   Raymond Peterson  An incentive spirometer is a tool that can help keep your lungs clear and active. This tool measures how well you are filling your lungs  with each breath. Taking long deep breaths may help reverse or decrease the chance of developing breathing (pulmonary) problems (especially infection) following:  A long period of time when you are unable to move or be active. BEFORE THE PROCEDURE   If the spirometer includes an indicator to show your best effort, your nurse or respiratory therapist will set it to a desired goal.  If possible, sit up straight or lean slightly forward. Try not to slouch.  Hold the incentive spirometer in an upright position. INSTRUCTIONS FOR USE   Sit on the edge of your bed if possible, or sit up as far as you can in bed or on a chair.  Hold the incentive spirometer in an upright position.  Breathe out normally.  Place the mouthpiece in your mouth and seal your lips tightly around it.  Breathe in slowly and as deeply as possible, raising the piston or the ball toward the top of the column.  Hold your breath for 3-5 seconds or for as long as possible. Allow the piston or ball to fall to the bottom of the column.  Remove the mouthpiece from your mouth and breathe out normally.  Rest for a few seconds and repeat Steps 1 through 7 at least 10 times every 1-2 hours when you are awake. Take your time and take a few normal breaths between deep breaths.  The spirometer may include an indicator to show your best effort. Use the indicator as a goal to work toward during each repetition.  After each set of 10 deep breaths, practice coughing to be sure your lungs are clear. If you have an incision (the cut made at the time of surgery),  support your incision when coughing by placing a pillow or rolled up towels firmly against it. Once you are able to get out of bed, walk around indoors and cough well. You may stop using the incentive spirometer when instructed by your caregiver.  RISKS AND COMPLICATIONS  Take your time so you do not get dizzy or light-headed.  If you are in pain, you may need to take or ask  for pain medication before doing incentive spirometry. It is harder to take a deep breath if you are having pain. AFTER USE  Rest and breathe slowly and easily.  It can be helpful to keep track of a log of your progress. Your caregiver can provide you with a simple table to help with this. If you are using the spirometer at home, follow these instructions: [place] IF:   You are having difficultly using the spirometer.  You have trouble using the spirometer as often as instructed.  Your pain medication is not giving enough relief while using the spirometer.  You develop fever of 100.5 F (38.1 C) or higher. SEEK IMMEDIATE MEDICAL CARE IF:   You cough up bloody sputum that had not been present before.  You develop fever of 102 F (38.9 C) or greater.  You develop worsening pain at or near the incision site. MAKE SURE YOU:   Understand these instructions.  Will watch your condition.  Will get help right away if you are not doing well or get worse. Document Released: 02/04/2007 Document Revised: 12/17/2011 Document Reviewed: 04/07/2007 ExitCare Patient Information 2014 ExitCare, [place].   ________________________________________________________________________  WHAT IS A BLOOD TRANSFUSION? Blood Transfusion Information  A transfusion is the replacement of blood or some of its parts. Blood is made up of multiple cells which provide different functions.  Red blood cells carry oxygen and are used for blood loss replacement.  White blood cells fight against infection.  Platelets control bleeding.  Plasma helps clot blood.  Other blood products are available for specialized needs, such as hemophilia or other clotting disorders. BEFORE THE TRANSFUSION  Who gives blood for transfusions?   Healthy volunteers who are fully evaluated to make sure their blood is safe. This is blood bank blood. Transfusion therapy is the safest it has ever been in the practice of  medicine. Before blood is taken from a donor, a complete history is taken to make sure that person has no history of diseases nor engages in risky social behavior (examples are intravenous drug use or sexual activity with multiple partners). The donor's travel history is screened to minimize risk of transmitting infections, such as malaria. The donated blood is tested for signs of infectious diseases, such as HIV and hepatitis. The blood is then tested to be sure it is compatible with you in order to minimize the chance of a transfusion reaction. If you or a relative donates blood, this is often done in anticipation of surgery and is not appropriate for emergency situations. It takes many days to process the donated blood. RISKS AND COMPLICATIONS Although transfusion therapy is very safe and saves many lives, the main dangers of transfusion include:   Getting an infectious disease.  Developing a transfusion reaction. This is an allergic reaction to something in the blood you were given. Every precaution is taken to prevent this. The decision to have a blood transfusion has been considered carefully by your caregiver before blood is given. Blood is not given unless the benefits outweigh the risks. AFTER THE TRANSFUSION  Right after receiving a blood transfusion, you will usually feel much better and more energetic. This is especially true if your red blood cells have gotten low (anemic). The transfusion raises the level of the red blood cells which carry oxygen, and this usually causes an energy increase.  The nurse administering the transfusion will monitor you carefully for complications. HOME CARE INSTRUCTIONS  No special instructions are needed after a transfusion. You may find your energy is better. Speak with your caregiver about any limitations on activity for underlying diseases you may have. SEEK MEDICAL CARE IF:   Your condition is not improving after your transfusion.  You develop  redness or irritation at the intravenous (IV) site. SEEK IMMEDIATE MEDICAL CARE IF:  Any of the following symptoms occur over the next 12 hours:  Shaking chills.  You have a temperature by mouth above 102 F (38.9 C), not controlled by medicine.  Chest, back, or muscle pain.  People around you feel you are not acting correctly or are confused.  Shortness of breath or difficulty breathing.  Dizziness and fainting.  You get a rash or develop hives.  You have a decrease in urine output.  Your urine turns a dark color or changes to pink, red, or brown. Any of the following symptoms occur over the next 10 days:  You have a temperature by mouth above 102 F (38.9 C), not controlled by medicine.  Shortness of breath.  Weakness after normal activity.  The white part of the eye turns yellow (jaundice).  You have a decrease in the amount of urine or are urinating less often.  Your urine turns a dark color or changes to pink, red, or brown. Document Released: 09/21/2000 Document Revised: 12/17/2011 Document Reviewed: 05/10/2008 ExitCare Patient Information 2014 ExitCare, [place].  _______________________________________________________________________   CLEAR LIQUID DIET   Foods Allowed                                                                     Foods Excluded  Coffee and tea, regular and decaf                             liquids that you cannot  Plain Jell-O in any flavor                                             see through such as: Fruit ices (not with fruit pulp)                                     milk, soups, orange juice  Iced Popsicles                                    All solid food Carbonated beverages, regular and diet  Cranberry, grape and apple juices Sports drinks like Gatorade Lightly seasoned clear broth or consume(fat free) Sugar, honey syrup  Sample Menu Breakfast                                Lunch                                      Supper Cranberry juice                    Beef broth                            Chicken broth Jell-O                                     Grape juice                           Apple juice Coffee or tea                        Jell-O                                      Popsicle                                                Coffee or tea                        Coffee or tea  _____________________________________________________________________

## 2016-02-25 NOTE — H&P (Signed)
TOTAL KNEE ADMISSION H&P  Patient is being admitted for left total knee arthroplasty vs resection and placement of antibiotic spacer.  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 male, has a history of pain and functional disability in the left knee due to arthritis and previously septic knee and has failed non-surgical conservative treatments for greater than 12 weeks to include NSAID's and/or analgesics, use of assistive devices and activity modification.  Onset of symptoms was abrupt, starting in October 2016 with rapidlly worsening course since that time. In October he was diagnosised with a left shoulder and left knee infection.  Patient states that these were take care of by Dr. Rennis Chris, who referred the patient to Dr. Charlann Boxer with regards to the knee.  The patient noted prior procedures on the knee to include  arthroscopy on the left knee(s).  Patient currently rates pain in the left knee(s) at 10 out of 10 with activity. Patient has night pain, worsening of pain with activity and weight bearing, pain that interferes with activities of daily living, pain with passive range of motion, crepitus and joint swelling.  Patient has evidence of periarticular osteophytes and joint space narrowing by imaging studies. There is no active infection.   Risks, benefits and expectations were discussed with the patient.  Risks including but not limited to the risk of anesthesia, blood clots, nerve damage, blood vessel damage, failure of the prosthesis, infection and up to and including death.  Patient understand the risks, benefits and expectations and wishes to proceed with surgery. We have reviewed with the patient as well as his family about the possibility of having to place an antibiotic spacer.  It would not be the expected surgery, but they do know that it's a possibility and the expectations and course if that was to  happen.  PCP: Pcp Not In System  D/C Plans:      Home with HHPT  Post-op Meds:       No Rx given  Tranexamic Acid:      To be given - topically (previous DVT)  Decadron:      Is to be given  FYI:     Xarelto then ASA (previous DVT)  Norco    Patient Active Problem List   Diagnosis Date Noted  . Elevated liver enzymes   . DVT of leg (deep venous thrombosis) (HCC) 08/24/2015  . Abnormal LFTs 08/20/2015  . Hypoalbuminemia 08/20/2015  . Acute deep vein thrombosis (DVT) of proximal vein of left lower extremity (HCC)   . Severe protein-calorie malnutrition (HCC)   . Venous thromboembolism (VTE) 08/19/2015  . Septic joint of left knee joint (HCC) 08/11/2015   Past Medical History  Diagnosis Date  . PONV (postoperative nausea and vomiting)     "just once"  . Arthritis     oa  kness & shoulders   . DVT (deep venous thrombosis) [facility])     Past Surgical History  Procedure Laterality Date  . Rotator cuff repair Left   . Knee arthroscopy Right   . Shoulder arthroscopy Right   . Knee arthroscopy Left 08/11/2015    Procedure: Left Knee Arthroscopic Lavage;  Surgeon: Francena Hanly, MD;  Location: [facility] OR;  Service: Orthopedics;  Laterality: Left;  . Fine needle aspiration Left 08/11/2015    Procedure: LEFT SHOULDER ASPIRATION;  Surgeon: Francena Hanly, MD;  Location: [facility] OR;  Service:  Orthopedics;  Laterality: Left;  . Shoulder arthroscopy Left 08/13/2015    Procedure: Arthroscopic Washout Of Left Shoulder;  Surgeon: Francena HanlyKevin Supple, MD;  [facility];  Service: Orthopedics;  Laterality: Left;  Marland Kitchen. Tympanostomy tube placement      as a child   . I&d extremity Left 08/25/2015    Procedure: IRRIGATION AND DEBRIDEMENT LEFT KNEE ;  Surgeon: Francena HanlyKevin Supple, MD;  Location: [facility] OR;  Service: Orthopedics;  Laterality: Left;  . Tee without cardioversion N/A 08/26/2015    Procedure: TRANSESOPHAGEAL ECHOCARDIOGRAM (TEE);  Surgeon: Lewayne BuntingBrian S Crenshaw, MD;  [facility];  Service: Cardiovascular;   Laterality: N/A;    No prescriptions prior to admission   No Known Allergies   Social History  Substance Use Topics  . Smoking status: Never Smoker   . Smokeless tobacco: Never Used  . Alcohol Use: No       Review of Systems  Constitutional: Negative.   HENT: Negative.   Eyes: Negative.   Respiratory: Negative.   Cardiovascular: Negative.   Gastrointestinal: Negative.   Genitourinary: Negative.   Musculoskeletal: Positive for joint pain.  Skin: Negative.   Neurological: Negative.   Endo/Heme/Allergies: Negative.   Psychiatric/Behavioral: Negative.     Objective:  Physical Exam  Constitutional: He is oriented to person, place, and time. He appears well-developed.  HENT:  Head: Normocephalic.  Eyes: Pupils are equal, round, and reactive to light.  Neck: Neck supple. No JVD present. No tracheal deviation present. No thyromegaly present.  Cardiovascular: Normal rate, regular rhythm, normal heart sounds and intact distal pulses.   Respiratory: Effort normal and breath sounds normal. No stridor. No respiratory distress. He has no wheezes.  GI: Soft. There is no tenderness. There is no guarding.  Musculoskeletal:       Left knee: He exhibits decreased range of motion, abnormal alignment and bony tenderness. He exhibits no ecchymosis, no deformity, no laceration and no erythema. Tenderness found.  Lymphadenopathy:    He has no cervical adenopathy.  Neurological: He is alert and oriented to person, place, and time.  Skin: Skin is warm and dry.  Psychiatric: He has a normal mood and affect.      Labs:  Estimated body mass index is 30.42 kg/(m^2) as calculated from the following:   Height as of 08/26/15: 5\' 11"  (1.803 m).   Weight as of 09/21/15: 98.884 kg (218 lb).   Imaging Review Plain radiographs demonstrate severe degenerative joint disease of the left knee(s). The overall alignment is neutral. The bone quality appears to be good for age and reported activity  level.  Assessment/Plan:  End stage arthritis, left knee   The patient history, physical examination, clinical judgment of the provider and imaging studies are consistent with end stage degenerative joint disease of the left knee(s) and total knee arthroplasty is deemed medically necessary. The treatment options including medical management, injection therapy arthroscopy and arthroplasty were discussed at length. The risks and benefits of total knee arthroplasty were presented and reviewed. The risks due to aseptic loosening, infection, stiffness, patella tracking problems, thromboembolic complications and other imponderables were discussed. The patient acknowledged the explanation, agreed to proceed with the plan and consent was signed. Patient is being admitted for inpatient treatment for surgery, pain control, PT, OT, prophylactic antibiotics, VTE prophylaxis, progressive ambulation and ADL's and discharge planning. The patient is planning to be discharged home with home health services.     Anastasio AuerbachMatthew S. Belen Pesch   PA-C  02/25/2016, 1:36 PM

## 2016-02-27 ENCOUNTER — Inpatient Hospital Stay (HOSPITAL_COMMUNITY): Payer: BLUE CROSS/BLUE SHIELD | Admitting: Anesthesiology

## 2016-02-27 ENCOUNTER — Encounter (HOSPITAL_COMMUNITY): Payer: Self-pay

## 2016-02-27 ENCOUNTER — Encounter (HOSPITAL_COMMUNITY)
Admission: RE | Disposition: A | Discharge status: Home Health | Payer: Self-pay | Source: Ambulatory Visit | Attending: Orthopedic Surgery

## 2016-02-27 ENCOUNTER — Inpatient Hospital Stay (HOSPITAL_COMMUNITY)
Admission: RE | Admit: 2016-02-27 | Discharge: 2016-02-28 | DRG: 470 | Disposition: A | Discharge status: Home Health | Payer: BLUE CROSS/BLUE SHIELD | Source: Ambulatory Visit | Attending: Orthopedic Surgery | Admitting: Orthopedic Surgery

## 2016-02-27 DIAGNOSIS — M1712 Unilateral primary osteoarthritis, left knee: Secondary | ICD-10-CM | POA: Diagnosis present

## 2016-02-27 DIAGNOSIS — Z86718 Personal history of other venous thrombosis and embolism: Secondary | ICD-10-CM

## 2016-02-27 DIAGNOSIS — M810 Age-related osteoporosis without current pathological fracture: Secondary | ICD-10-CM | POA: Diagnosis present

## 2016-02-27 DIAGNOSIS — M25562 Pain in left knee: Secondary | ICD-10-CM | POA: Diagnosis present

## 2016-02-27 DIAGNOSIS — Z6834 Body mass index (BMI) 34.0-34.9, adult: Secondary | ICD-10-CM

## 2016-02-27 DIAGNOSIS — E669 Obesity, unspecified: Secondary | ICD-10-CM | POA: Diagnosis present

## 2016-02-27 DIAGNOSIS — Z96652 Presence of left artificial knee joint: Secondary | ICD-10-CM

## 2016-02-27 DIAGNOSIS — Z96659 Presence of unspecified artificial knee joint: Secondary | ICD-10-CM

## 2016-02-27 HISTORY — PX: TOTAL KNEE ARTHROPLASTY: SHX125

## 2016-02-27 SURGERY — ARTHROPLASTY, KNEE, TOTAL
Anesthesia: Spinal | Site: Knee | Laterality: Left

## 2016-02-27 MED ORDER — HYDROMORPHONE HCL 1 MG/ML IJ SOLN
0.5000 mg | INTRAMUSCULAR | Status: DC | PRN
  Administered 2016-02-27: 1 mg via INTRAVENOUS
  Filled 2016-02-27: qty 1

## 2016-02-27 MED ORDER — FERROUS SULFATE 325 (65 FE) MG PO TABS
325.0000 mg | ORAL_TABLET | Freq: Three times a day (TID) | ORAL | Status: DC
  Administered 2016-02-28 (×2): 325 mg via ORAL
  Filled 2016-02-27 (×2): qty 1

## 2016-02-27 MED ORDER — HYDROMORPHONE HCL 1 MG/ML IJ SOLN
0.2500 mg | INTRAMUSCULAR | Status: DC | PRN
  Administered 2016-02-27 (×2): 0.5 mg via INTRAVENOUS

## 2016-02-27 MED ORDER — DOCUSATE SODIUM 100 MG PO CAPS
100.0000 mg | ORAL_CAPSULE | Freq: Two times a day (BID) | ORAL | Status: DC
  Administered 2016-02-27 – 2016-02-28 (×2): 100 mg via ORAL
  Filled 2016-02-27 (×2): qty 1

## 2016-02-27 MED ORDER — STERILE WATER FOR IRRIGATION IR SOLN
Status: DC | PRN
  Administered 2016-02-27: 2000 mL

## 2016-02-27 MED ORDER — DEXAMETHASONE SODIUM PHOSPHATE 10 MG/ML IJ SOLN
10.0000 mg | Freq: Once | INTRAMUSCULAR | Status: AC
  Administered 2016-02-27: 10 mg via INTRAVENOUS

## 2016-02-27 MED ORDER — MIDAZOLAM HCL 2 MG/2ML IJ SOLN
INTRAMUSCULAR | Status: AC
  Filled 2016-02-27: qty 2

## 2016-02-27 MED ORDER — FENTANYL CITRATE (PF) 100 MCG/2ML IJ SOLN
INTRAMUSCULAR | Status: AC
  Filled 2016-02-27: qty 2

## 2016-02-27 MED ORDER — HYDROCODONE-ACETAMINOPHEN 7.5-325 MG PO TABS
1.0000 | ORAL_TABLET | ORAL | Status: DC
  Administered 2016-02-27 – 2016-02-28 (×6): 2 via ORAL
  Filled 2016-02-27 (×6): qty 2

## 2016-02-27 MED ORDER — PHENOL 1.4 % MT LIQD
1.0000 | OROMUCOSAL | Status: DC | PRN
  Filled 2016-02-27: qty 177

## 2016-02-27 MED ORDER — HYDROMORPHONE HCL 1 MG/ML IJ SOLN
INTRAMUSCULAR | Status: AC
  Filled 2016-02-27: qty 1

## 2016-02-27 MED ORDER — LACTATED RINGERS IV SOLN
INTRAVENOUS | Status: DC | PRN
  Administered 2016-02-27 (×3): via INTRAVENOUS

## 2016-02-27 MED ORDER — METHOCARBAMOL 1000 MG/10ML IJ SOLN
500.0000 mg | Freq: Four times a day (QID) | INTRAVENOUS | Status: DC | PRN
  Administered 2016-02-27: 500 mg via INTRAVENOUS
  Filled 2016-02-27: qty 5
  Filled 2016-02-27: qty 550

## 2016-02-27 MED ORDER — VANCOMYCIN HCL 1000 MG IV SOLR
INTRAVENOUS | Status: AC
  Filled 2016-02-27: qty 1000

## 2016-02-27 MED ORDER — DIPHENHYDRAMINE HCL 25 MG PO CAPS: 25.0000 mg | ORAL_CAPSULE | Freq: Four times a day (QID) | ORAL | Status: DC | PRN

## 2016-02-27 MED ORDER — SODIUM CHLORIDE 0.9 % IV SOLN
INTRAVENOUS | Status: DC
  Administered 2016-02-27: 21:00:00 via INTRAVENOUS

## 2016-02-27 MED ORDER — CEFAZOLIN SODIUM-DEXTROSE 2-4 GM/100ML-% IV SOLN
2.0000 g | INTRAVENOUS | Status: AC
  Administered 2016-02-27: 2 g via INTRAVENOUS
  Filled 2016-02-27: qty 100

## 2016-02-27 MED ORDER — DEXAMETHASONE SODIUM PHOSPHATE 10 MG/ML IJ SOLN
INTRAMUSCULAR | Status: AC
  Filled 2016-02-27: qty 1

## 2016-02-27 MED ORDER — HYDROMORPHONE HCL 1 MG/ML IJ SOLN
INTRAMUSCULAR | Status: DC | PRN
  Administered 2016-02-27: 0.5 mg via INTRAVENOUS
  Administered 2016-02-27: 1 mg via INTRAVENOUS
  Administered 2016-02-27: 0.5 mg via INTRAVENOUS

## 2016-02-27 MED ORDER — SODIUM CHLORIDE 0.9 % IJ SOLN
INTRAMUSCULAR | Status: AC
  Filled 2016-02-27: qty 50

## 2016-02-27 MED ORDER — LACTATED RINGERS IV SOLN
INTRAVENOUS | Status: DC
  Administered 2016-02-27: 1000 mL via INTRAVENOUS

## 2016-02-27 MED ORDER — KETOROLAC TROMETHAMINE 30 MG/ML IJ SOLN
INTRAMUSCULAR | Status: DC | PRN
  Administered 2016-02-27: 30 mg via INTRAVENOUS

## 2016-02-27 MED ORDER — POLYETHYLENE GLYCOL 3350 17 G PO PACK
17.0000 g | PACK | Freq: Two times a day (BID) | ORAL | Status: DC
  Administered 2016-02-27: 17 g via ORAL
  Filled 2016-02-27 (×2): qty 1

## 2016-02-27 MED ORDER — HYDROMORPHONE HCL 2 MG/ML IJ SOLN
INTRAMUSCULAR | Status: AC
  Filled 2016-02-27: qty 1

## 2016-02-27 MED ORDER — FENTANYL CITRATE (PF) 100 MCG/2ML IJ SOLN
INTRAMUSCULAR | Status: DC | PRN
  Administered 2016-02-27: 50 ug via INTRAVENOUS
  Administered 2016-02-27: 100 ug via INTRAVENOUS
  Administered 2016-02-27 (×4): 50 ug via INTRAVENOUS

## 2016-02-27 MED ORDER — BUPIVACAINE HCL (PF) 0.75 % IJ SOLN
INTRAMUSCULAR | Status: DC | PRN
Start: 2016-02-27 — End: 2016-02-27
  Administered 2016-02-27: 2 mL via INTRATHECAL

## 2016-02-27 MED ORDER — VANCOMYCIN HCL 1000 MG IV SOLR
INTRAVENOUS | Status: DC | PRN
  Administered 2016-02-27: 1000 mg

## 2016-02-27 MED ORDER — BISACODYL 10 MG RE SUPP: 10.0000 mg | Freq: Every day | RECTAL | Status: DC | PRN

## 2016-02-27 MED ORDER — HYDROMORPHONE HCL 1 MG/ML IJ SOLN: 0.2500 mg | INTRAMUSCULAR | Status: DC | PRN

## 2016-02-27 MED ORDER — TRANEXAMIC ACID 1000 MG/10ML IV SOLN
2000.0000 mg | INTRAVENOUS | Status: DC | PRN
  Administered 2016-02-27: 2000 mg via INTRAVENOUS

## 2016-02-27 MED ORDER — METOCLOPRAMIDE HCL 5 MG PO TABS: 5.0000 mg | ORAL_TABLET | Freq: Three times a day (TID) | ORAL | Status: DC | PRN

## 2016-02-27 MED ORDER — BUPIVACAINE-EPINEPHRINE (PF) 0.25% -1:200000 IJ SOLN
INTRAMUSCULAR | Status: AC
  Filled 2016-02-27: qty 30

## 2016-02-27 MED ORDER — PHENYLEPHRINE 40 MCG/ML (10ML) SYRINGE FOR IV PUSH (FOR BLOOD PRESSURE SUPPORT)
PREFILLED_SYRINGE | INTRAVENOUS | Status: AC
  Filled 2016-02-27: qty 10

## 2016-02-27 MED ORDER — MAGNESIUM CITRATE PO SOLN: 1.0000 | Freq: Once | ORAL | Status: DC | PRN

## 2016-02-27 MED ORDER — PROPOFOL 10 MG/ML IV BOLUS
INTRAVENOUS | Status: DC | PRN
  Administered 2016-02-27: 30 mg via INTRAVENOUS
  Administered 2016-02-27: 200 mg via INTRAVENOUS
  Administered 2016-02-27 (×3): 30 mg via INTRAVENOUS

## 2016-02-27 MED ORDER — CELECOXIB 200 MG PO CAPS
200.0000 mg | ORAL_CAPSULE | Freq: Two times a day (BID) | ORAL | Status: DC
  Administered 2016-02-27 – 2016-02-28 (×2): 200 mg via ORAL
  Filled 2016-02-27 (×2): qty 1

## 2016-02-27 MED ORDER — BUPIVACAINE-EPINEPHRINE (PF) 0.25% -1:200000 IJ SOLN
INTRAMUSCULAR | Status: DC | PRN
  Administered 2016-02-27: 30 mL via PERINEURAL

## 2016-02-27 MED ORDER — MENTHOL 3 MG MT LOZG: 1.0000 | LOZENGE | OROMUCOSAL | Status: DC | PRN

## 2016-02-27 MED ORDER — ONDANSETRON HCL 4 MG/2ML IJ SOLN: 4.0000 mg | Freq: Four times a day (QID) | INTRAMUSCULAR | Status: DC | PRN

## 2016-02-27 MED ORDER — SODIUM CHLORIDE 0.9 % IJ SOLN
INTRAMUSCULAR | Status: DC | PRN
  Administered 2016-02-27: 30 mL via INTRAVENOUS

## 2016-02-27 MED ORDER — METOCLOPRAMIDE HCL 5 MG/ML IJ SOLN: 5.0000 mg | Freq: Three times a day (TID) | INTRAMUSCULAR | Status: DC | PRN

## 2016-02-27 MED ORDER — DEXAMETHASONE SODIUM PHOSPHATE 10 MG/ML IJ SOLN: 10.0000 mg | Freq: Once | INTRAMUSCULAR | Status: DC

## 2016-02-27 MED ORDER — KETOROLAC TROMETHAMINE 30 MG/ML IJ SOLN
INTRAMUSCULAR | Status: AC
  Filled 2016-02-27: qty 1

## 2016-02-27 MED ORDER — PROPOFOL 10 MG/ML IV BOLUS
INTRAVENOUS | Status: AC
  Filled 2016-02-27: qty 20

## 2016-02-27 MED ORDER — CHLORHEXIDINE GLUCONATE 4 % EX LIQD: 60.0000 mL | Freq: Once | CUTANEOUS | Status: DC

## 2016-02-27 MED ORDER — PHENYLEPHRINE HCL 10 MG/ML IJ SOLN
INTRAMUSCULAR | Status: DC | PRN
  Administered 2016-02-27: 80 ug via INTRAVENOUS

## 2016-02-27 MED ORDER — CEFAZOLIN SODIUM-DEXTROSE 2-4 GM/100ML-% IV SOLN
2.0000 g | Freq: Four times a day (QID) | INTRAVENOUS | Status: AC
  Administered 2016-02-28 (×2): 2 g via INTRAVENOUS
  Filled 2016-02-27 (×2): qty 100

## 2016-02-27 MED ORDER — SODIUM CHLORIDE 0.9 % IR SOLN
Status: DC | PRN
  Administered 2016-02-27: 1000 mL

## 2016-02-27 MED ORDER — TRANEXAMIC ACID 1000 MG/10ML IV SOLN
2000.0000 mg | Freq: Once | INTRAVENOUS | Status: DC
  Filled 2016-02-27: qty 20

## 2016-02-27 MED ORDER — PROPOFOL 10 MG/ML IV BOLUS
INTRAVENOUS | Status: AC
  Filled 2016-02-27: qty 60

## 2016-02-27 MED ORDER — PROPOFOL 500 MG/50ML IV EMUL
INTRAVENOUS | Status: DC | PRN
  Administered 2016-02-27: 100 ug/kg/min via INTRAVENOUS

## 2016-02-27 MED ORDER — FENTANYL CITRATE (PF) 250 MCG/5ML IJ SOLN
INTRAMUSCULAR | Status: AC
  Filled 2016-02-27: qty 5

## 2016-02-27 MED ORDER — ONDANSETRON HCL 4 MG PO TABS: 4.0000 mg | ORAL_TABLET | Freq: Four times a day (QID) | ORAL | Status: DC | PRN

## 2016-02-27 MED ORDER — MIDAZOLAM HCL 5 MG/5ML IJ SOLN
INTRAMUSCULAR | Status: DC | PRN
  Administered 2016-02-27 (×2): 2 mg via INTRAVENOUS

## 2016-02-27 MED ORDER — RIVAROXABAN 10 MG PO TABS
10.0000 mg | ORAL_TABLET | ORAL | Status: DC
  Administered 2016-02-28: 10 mg via ORAL
  Filled 2016-02-27: qty 1

## 2016-02-27 MED ORDER — CEFAZOLIN SODIUM-DEXTROSE 2-4 GM/100ML-% IV SOLN
INTRAVENOUS | Status: AC
  Filled 2016-02-27: qty 100

## 2016-02-27 MED ORDER — METHOCARBAMOL 500 MG PO TABS: 500.0000 mg | ORAL_TABLET | Freq: Four times a day (QID) | ORAL | Status: DC | PRN

## 2016-02-27 MED ORDER — 0.9 % SODIUM CHLORIDE (POUR BTL) OPTIME
TOPICAL | Status: DC | PRN
  Administered 2016-02-27: 1000 mL

## 2016-02-27 MED ORDER — ALUM & MAG HYDROXIDE-SIMETH 200-200-20 MG/5ML PO SUSP: 30.0000 mL | ORAL | Status: DC | PRN

## 2016-02-27 SURGICAL SUPPLY — 46 items
BAG DECANTER FOR FLEXI CONT (MISCELLANEOUS) IMPLANT
BAG ZIPLOCK 12X15 (MISCELLANEOUS) IMPLANT
BANDAGE ACE 6X5 VEL STRL LF (GAUZE/BANDAGES/DRESSINGS) ×3 IMPLANT
BLADE SAW SGTL 13.0X1.19X90.0M (BLADE) ×3 IMPLANT
BNDG ELASTIC 6X10 VLCR STRL LF (GAUZE/BANDAGES/DRESSINGS) ×3 IMPLANT
BONE CEMENT GENTAMICIN (Cement) ×6 IMPLANT
BOWL SMART MIX CTS (DISPOSABLE) ×3 IMPLANT
CAPT KNEE TOTAL 3 ATTUNE ×3 IMPLANT
CEMENT BONE GENTAMICIN 40 (Cement) ×2 IMPLANT
CLOTH BEACON ORANGE TIMEOUT ST (SAFETY) ×3 IMPLANT
CUFF TOURN SGL QUICK 34 (TOURNIQUET CUFF) ×2
CUFF TRNQT CYL 34X4X40X1 (TOURNIQUET CUFF) ×1 IMPLANT
DECANTER SPIKE VIAL GLASS SM (MISCELLANEOUS) ×3 IMPLANT
DRAPE U-SHAPE 47X51 STRL (DRAPES) ×3 IMPLANT
DRESSING AQUACEL AG SP 3.5X10 (GAUZE/BANDAGES/DRESSINGS) ×1 IMPLANT
DRSG AQUACEL AG SP 3.5X10 (GAUZE/BANDAGES/DRESSINGS) ×3
DURAPREP 26ML APPLICATOR (WOUND CARE) ×6 IMPLANT
ELECT REM PT RETURN 9FT ADLT (ELECTROSURGICAL) ×3
ELECTRODE REM PT RTRN 9FT ADLT (ELECTROSURGICAL) ×1 IMPLANT
GLOVE BIOGEL M 7.0 STRL (GLOVE) ×3 IMPLANT
GLOVE BIOGEL PI IND STRL 7.5 (GLOVE) ×1 IMPLANT
GLOVE BIOGEL PI IND STRL 8.5 (GLOVE) ×1 IMPLANT
GLOVE BIOGEL PI INDICATOR 7.5 (GLOVE) ×2
GLOVE BIOGEL PI INDICATOR 8.5 (GLOVE) ×2
GLOVE ECLIPSE 8.0 STRL XLNG CF (GLOVE) ×3 IMPLANT
GLOVE ORTHO TXT STRL SZ7.5 (GLOVE) ×6 IMPLANT
GOWN STRL REUS W/TWL LRG LVL3 (GOWN DISPOSABLE) ×6 IMPLANT
GOWN STRL REUS W/TWL XL LVL3 (GOWN DISPOSABLE) ×6 IMPLANT
HANDPIECE INTERPULSE COAX TIP (DISPOSABLE) ×2
LIQUID BAND (GAUZE/BANDAGES/DRESSINGS) ×3 IMPLANT
MANIFOLD NEPTUNE II (INSTRUMENTS) ×3 IMPLANT
PACK TOTAL KNEE CUSTOM (KITS) ×3 IMPLANT
POSITIONER SURGICAL ARM (MISCELLANEOUS) ×3 IMPLANT
SET HNDPC FAN SPRY TIP SCT (DISPOSABLE) ×1 IMPLANT
SET PAD KNEE POSITIONER (MISCELLANEOUS) ×3 IMPLANT
SUT MNCRL AB 4-0 PS2 18 (SUTURE) ×3 IMPLANT
SUT VIC AB 1 CT1 36 (SUTURE) ×3 IMPLANT
SUT VIC AB 2-0 CT1 27 (SUTURE) ×6
SUT VIC AB 2-0 CT1 TAPERPNT 27 (SUTURE) ×3 IMPLANT
SUT VLOC 180 0 24IN GS25 (SUTURE) ×3 IMPLANT
SYR 50ML LL SCALE MARK (SYRINGE) ×3 IMPLANT
TRAY FOLEY W/METER SILVER 14FR (SET/KITS/TRAYS/PACK) IMPLANT
TRAY FOLEY W/METER SILVER 16FR (SET/KITS/TRAYS/PACK) ×3 IMPLANT
WATER STERILE IRR 1500ML POUR (IV SOLUTION) ×3 IMPLANT
WRAP KNEE MAXI GEL POST OP (GAUZE/BANDAGES/DRESSINGS) ×3 IMPLANT
YANKAUER SUCT BULB TIP 10FT TU (MISCELLANEOUS) ×3 IMPLANT

## 2016-02-27 NOTE — Brief Op Note (Signed)
02/27/2016  9:09 PM  PATIENT:  Raymond CraverJeremy D Peterson  45 y.o. 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  PROCEDURE:  Procedure(s) with comments: LEFT TOTAL KNEE ARTHROPLASTY  (Left) - converted to General Anesthesia  SURGEON:  Surgeon(s) and Role:    * Durene RomansMatthew Jahki Witham, MD - Primary  PHYSICIAN ASSISTANT: Lanney GinsMatthew Babish, PA-C  ANESTHESIA:   spinal and general  EBL:  Total I/O In: 250 [I.V.:250] Out: 100 [Urine:100]  BLOOD ADMINISTERED:none  DRAINS: none   LOCAL MEDICATIONS USED:  MARCAINE     SPECIMEN:  No Specimen  DISPOSITION OF SPECIMEN:  N/A  COUNTS:  YES  TOURNIQUET:   Total Tourniquet Time Documented: Thigh (Left) - 51 minutes Total: Thigh (Left) - 51 minutes   DICTATION: .Other Dictation: Dictation Number [id number]  PLAN OF CARE: Admit to inpatient   PATIENT DISPOSITION:  PACU - hemodynamically stable.   Delay start of Pharmacological VTE agent (>24hrs) due to surgical blood loss or risk of bleeding: no

## 2016-02-27 NOTE — Transfer of Care (Signed)
Immediate Anesthesia Transfer of Care Note  Patient: Raymond Peterson  Procedure(s) Performed: Procedure(s) with comments: LEFT TOTAL KNEE ARTHROPLASTY  (Left) - converted to General Anesthesia  Patient Location: PACU  Anesthesia Type:General  Level of Consciousness:  sedated, patient cooperative and responds to stimulation  Airway & Oxygen Therapy:Patient Spontanous Breathing and Patient connected to face mask oxgen  Post-op Assessment:  Report given to PACU RN and Post -op Vital signs reviewed and stable  Post vital signs:  Reviewed and stable  Last Vitals:  Filed Vitals:   02/27/16 1138  BP: 138/72  Pulse: 85  Temp: 37 C  Resp: 16    Complications: No apparent anesthesia complications

## 2016-02-27 NOTE — Anesthesia Preprocedure Evaluation (Signed)
Anesthesia Evaluation  Patient identified by MRN, date of birth, ID band Patient awake    Reviewed: Allergy & Precautions, NPO status , Patient's Chart, lab work & pertinent test results  History of Anesthesia Complications (+) PONV  Airway Mallampati: I       Dental  (+) Teeth Intact   Pulmonary neg pulmonary ROS,    breath sounds clear to auscultation       Cardiovascular negative cardio ROS   Rhythm:Regular Rate:Normal     Neuro/Psych negative neurological ROS     GI/Hepatic negative GI ROS, Neg liver ROS,   Endo/Other  negative endocrine ROS  Renal/GU negative Renal ROS     Musculoskeletal  (+) Arthritis ,   Abdominal   Peds  Hematology negative hematology ROS (+) Hxx of DVT p surg on knee   Anesthesia Other Findings   Reproductive/Obstetrics                             Anesthesia Physical Anesthesia Plan  ASA: I  Anesthesia Plan: Spinal   Post-op Pain Management:    Induction: Intravenous  Airway Management Planned: Natural Airway and Simple Face Mask  Additional Equipment:   Intra-op Plan:   Post-operative Plan: Extubation in OR  Informed Consent: I have reviewed the patients History and Physical, chart, labs and discussed the procedure including the risks, benefits and alternatives for the proposed anesthesia with the patient or authorized representative who has indicated his/her understanding and acceptance.     Plan Discussed with: CRNA and Surgeon  Anesthesia Plan Comments:         Anesthesia Quick Evaluation

## 2016-02-27 NOTE — Anesthesia Procedure Notes (Addendum)
Spinal Patient location during procedure: OR Start time: 02/27/2016 4:20 PM End time: 02/27/2016 4:35 PM Staffing Anesthesiologist: MASSAGEE, TERRY Performed by: anesthesiologist  Preanesthetic Checklist Completed: patient identified, site marked, pre-op evaluation, timeout performed, IV checked, risks and benefits discussed and monitors and equipment checked Spinal Block Patient position: sitting Prep: Betadine Patient monitoring: heart rate, cardiac monitor, continuous pulse ox and blood pressure Approach: left paramedian Location: L3-4 Needle Needle type: Tuohy  Needle gauge: 25 G Needle length: 5 cm Needle insertion depth: 5 cm Assessment Sensory level: T10 Additional Notes Several attempts midline and paramedian per Crna. Then several attempts by me , one midline and then paramedian before clear CSf. Tolerated well  Procedure Name: LMA Insertion Date/Time: 02/27/2016 5:04 PM Performed by: Anastasio ChampionEVANS, Mckenize Mezera E Pre-anesthesia Checklist: Patient identified, Emergency Drugs available, Suction available and Patient being monitored Patient Re-evaluated:Patient Re-evaluated prior to inductionOxygen Delivery Method: Circle system utilized Preoxygenation: Pre-oxygenation with 100% oxygen Intubation Type: IV induction LMA: LMA inserted LMA Size: 5.0 Number of attempts: 1 Placement Confirmation: positive ETCO2 and breath sounds checked- equal and bilateral Tube secured with: Tape Dental Injury: Teeth and Oropharynx as per pre-operative assessment

## 2016-02-27 NOTE — Interval H&P Note (Signed)
History and Physical Interval Note:  02/27/2016 3:27 PM  Raymond Peterson  has presented today for surgery, with the diagnosis of LEFT KNEE PROGRESSIVE OA   The various methods of treatment have been discussed with the patient and family. After consideration of risks, benefits and other options for treatment, the patient has consented to  Procedure(s): LEFT TOTAL KNEE ARTHROPLASTY VS RESECTION PLACEMENT OF ANTIBIOTIC SPACERS  (Left) as a surgical intervention .  The patient's history has been reviewed, patient examined, no change in status, stable for surgery.  I have reviewed the patient's chart and labs.  Questions were answered to the patient's satisfaction.     Shelda PalLIN,Margaretmary Prisk D

## 2016-02-28 ENCOUNTER — Encounter (HOSPITAL_COMMUNITY): Payer: Self-pay | Admitting: Orthopedic Surgery

## 2016-02-28 LAB — CBC
HEMATOCRIT: 40 % (ref 39.0–52.0)
HEMOGLOBIN: 13.9 g/dL (ref 13.0–17.0)
MCH: 31.2 pg (ref 26.0–34.0)
MCHC: 34.8 g/dL (ref 30.0–36.0)
MCV: 89.9 fL (ref 78.0–100.0)
Platelets: 212 10*3/uL (ref 150–400)
RBC: 4.45 MIL/uL (ref 4.22–5.81)
RDW: 12.8 % (ref 11.5–15.5)
WBC: 10.3 10*3/uL (ref 4.0–10.5)

## 2016-02-28 LAB — BASIC METABOLIC PANEL
ANION GAP: 9 (ref 5–15)
BUN: 19 mg/dL (ref 6–20)
CHLORIDE: 103 mmol/L (ref 101–111)
CO2: 23 mmol/L (ref 22–32)
Calcium: 9 mg/dL (ref 8.9–10.3)
Creatinine, Ser: 0.96 mg/dL (ref 0.61–1.24)
GFR calc non Af Amer: >60 mL/min (ref >60)
Glucose, Bld: 153 mg/dL — ABNORMAL HIGH (ref 65–99)
POTASSIUM: 4.7 mmol/L (ref 3.5–5.1)
Sodium: 135 mmol/L (ref 135–145)

## 2016-02-28 LAB — TYPE AND SCREEN
ABO/RH(D): O POS
ANTIBODY SCREEN: NEGATIVE

## 2016-02-28 MED ORDER — CELECOXIB 200 MG PO CAPS: 200.0000 mg | ORAL_CAPSULE | Freq: Two times a day (BID) | ORAL | Status: AC

## 2016-02-28 MED ORDER — ASPIRIN EC 325 MG PO TBEC: 325.0000 mg | DELAYED_RELEASE_TABLET | Freq: Two times a day (BID) | ORAL | Status: AC

## 2016-02-28 MED ORDER — POLYETHYLENE GLYCOL 3350 17 G PO PACK: 17.0000 g | PACK | Freq: Two times a day (BID) | ORAL | Status: AC

## 2016-02-28 MED ORDER — METHOCARBAMOL 500 MG PO TABS: 500.0000 mg | ORAL_TABLET | Freq: Four times a day (QID) | ORAL | Status: AC | PRN

## 2016-02-28 MED ORDER — FERROUS SULFATE 325 (65 FE) MG PO TABS: 325.0000 mg | ORAL_TABLET | Freq: Three times a day (TID) | ORAL | Status: AC

## 2016-02-28 MED ORDER — RIVAROXABAN 10 MG PO TABS: 10.0000 mg | ORAL_TABLET | ORAL | Status: AC

## 2016-02-28 MED ORDER — HYDROCODONE-ACETAMINOPHEN 7.5-325 MG PO TABS: 1.0000 | ORAL_TABLET | ORAL | Status: AC | PRN

## 2016-02-28 MED ORDER — DOCUSATE SODIUM 100 MG PO CAPS: 100.0000 mg | ORAL_CAPSULE | Freq: Two times a day (BID) | ORAL | Status: AC

## 2016-02-28 NOTE — Progress Notes (Signed)
OT Cancellation Note  Patient Details Name: Raymond Peterson MRN: [id number] DOB: 1971/08/09   Cancelled Treatment:    Reason Eval/Treat Not Completed: OT screened, no needs identified, will sign off  Angelene GiovanniConarpe, Dontasia Miranda M  Alenna Russell [place], OTR/L [phone]  02/28/2016, 2:41 PM

## 2016-02-28 NOTE — Discharge Instructions (Addendum)
Information on my medicine - XARELTO (Rivaroxaban)  This medication education was reviewed with me or my healthcare representative as part of my discharge preparation.  The pharmacist that spoke with me during my hospital stay was:  Clance Boll, [facility]  Why was Xarelto prescribed for you? Xarelto was prescribed for you to reduce the risk of blood clots forming after orthopedic surgery. The medical term for these abnormal blood clots is venous thromboembolism (VTE).  What do you need to know about xarelto ? Take your Xarelto ONCE DAILY at the same time every day. You may take it either with or without food.  If you have difficulty swallowing the tablet whole, you may crush it and mix in applesauce just prior to taking your dose.  Take Xarelto exactly as prescribed by your doctor and DO NOT stop taking Xarelto without talking to the doctor who prescribed the medication.  Stopping without other VTE prevention medication to take the place of Xarelto may increase your risk of developing a clot.  After discharge, you should have regular check-up appointments with your healthcare provider that is prescribing your Xarelto.    What do you do if you miss a dose? If you miss a dose, take it as soon as you remember on the same day then continue your regularly scheduled once daily regimen the next day. Do not take two doses of Xarelto on the same day.   Important Safety Information A possible side effect of Xarelto is bleeding. You should call your healthcare provider right away if you experience any of the following: ? Bleeding from an injury or your nose that does not stop. ? Unusual colored urine (red or dark brown) or unusual colored stools (red or black). ? Unusual bruising for unknown reasons. ? A serious fall or if you hit your head (even if there is no bleeding).  Some medicines may interact with Xarelto and might increase your risk of bleeding while on Xarelto. To help avoid this,  consult your healthcare provider or pharmacist prior to using any new prescription or non-prescription medications, including herbals, vitamins, non-steroidal anti-inflammatory drugs (NSAIDs) and supplements.  This website has more information on Xarelto: VisitDestination.com.br.   _____________________________________________________________________________________________________________________    INSTRUCTIONS AFTER JOINT REPLACEMENT   o Remove items at home which could result in a fall. This includes throw rugs or furniture in walking pathways o ICE to the affected joint every three hours while awake for 30 minutes at a time, for at least the first 3-5 days, and then as needed for pain and swelling.  Continue to use ice for pain and swelling. You may notice swelling that will progress down to the foot and ankle.  This is normal after surgery.  Elevate your leg when you are not up walking on it.   o Continue to use the breathing machine you got in the hospital (incentive spirometer) which will help keep your temperature down.  It is common for your temperature to cycle up and down following surgery, especially at night when you are not up moving around and exerting yourself.  The breathing machine keeps your lungs expanded and your temperature down.   DIET:  As you were doing prior to hospitalization, we recommend a well-balanced diet.  DRESSING / WOUND CARE / SHOWERING  Keep the surgical dressing until follow up.  The dressing is water proof, so you can shower without any extra covering.  IF THE DRESSING FALLS OFF or the wound gets wet inside, change the dressing  with sterile gauze.  Please use good hand washing techniques before changing the dressing.  Do not use any lotions or creams on the incision until instructed by your surgeon.    ACTIVITY  o Increase activity slowly as tolerated, but follow the weight bearing instructions below.   o No driving for 6 weeks or until further direction  given by your physician.  You cannot drive while taking narcotics.  o No lifting or carrying greater than 10 lbs. until further directed by your surgeon. o Avoid periods of inactivity such as sitting longer than an hour when not asleep. This helps prevent blood clots.  o You may return to work once you are authorized by your doctor.     WEIGHT BEARING   Weight bearing as tolerated with assist device (walker, cane, etc) as directed, use it as long as suggested by your surgeon or therapist, typically at least 4-6 weeks.   EXERCISES  Results after joint replacement surgery are often greatly improved when you follow the exercise, range of motion and muscle strengthening exercises prescribed by your doctor. Safety measures are also important to protect the joint from further injury. Any time any of these exercises cause you to have increased pain or swelling, decrease what you are doing until you are comfortable again and then slowly increase them. If you have problems or questions, call your caregiver or physical therapist for advice.   Rehabilitation is important following a joint replacement. After just a few days of immobilization, the muscles of the leg can become weakened and shrink (atrophy).  These exercises are designed to build up the tone and strength of the thigh and leg muscles and to improve motion. Often times heat used for twenty to thirty minutes before working out will loosen up your tissues and help with improving the range of motion but do not use heat for the first two weeks following surgery (sometimes heat can increase post-operative swelling).   These exercises can be done on a training (exercise) mat, on the floor, on a table or on a bed. Use whatever works the best and is most comfortable for you.    Use music or television while you are exercising so that the exercises are a pleasant break in your day. This will make your life better with the exercises acting as a break in  your routine that you can look forward to.   Perform all exercises about fifteen times, three times per day or as directed.  You should exercise both the operative leg and the other leg as well.  Exercises include:    Quad Sets - Tighten up the muscle on the front of the thigh (Quad) and hold for 5-10 seconds.    Straight Leg Raises - With your knee straight (if you were given a brace, keep it on), lift the leg to 60 degrees, hold for 3 seconds, and slowly lower the leg.  Perform this exercise against resistance later as your leg gets stronger.   Leg Slides: Lying on your back, slowly slide your foot toward your buttocks, bending your knee up off the floor (only go as far as is comfortable). Then slowly slide your foot back down until your leg is flat on the floor again.   Angel Wings: Lying on your back spread your legs to the side as far apart as you can without causing discomfort.   Hamstring Strength:  Lying on your back, push your heel against the floor with your leg straight  by tightening up the muscles of your buttocks.  Repeat, but this time bend your knee to a comfortable angle, and push your heel against the floor.  You may put a pillow under the heel to make it more comfortable if necessary.   A rehabilitation program following joint replacement surgery can speed recovery and prevent re-injury in the future due to weakened muscles. Contact your doctor or a physical therapist for more information on knee rehabilitation.    CONSTIPATION  Constipation is defined medically as fewer than three stools per week and severe constipation as less than one stool per week.  Even if you have a regular bowel pattern at home, your normal regimen is likely to be disrupted due to multiple reasons following surgery.  Combination of anesthesia, postoperative narcotics, change in appetite and fluid intake all can affect your bowels.   YOU MUST use at least one of the following options; they are listed in  order of increasing strength to get the job done.  They are all available over the counter, and you may need to use some, POSSIBLY even all of these options:    Drink plenty of fluids (prune juice may be helpful) and high fiber foods Colace 100 mg by mouth twice a day  Senokot for constipation as directed and as needed Dulcolax (bisacodyl), take with full glass of water  Miralax (polyethylene glycol) once or twice a day as needed.  If you have tried all these things and are unable to have a bowel movement in the first 3-4 days after surgery call either your surgeon or your primary doctor.    If you experience loose stools or diarrhea, hold the medications until you stool forms back up.  If your symptoms do not get better within 1 week or if they get worse, check with your doctor.  If you experience "the worst abdominal pain ever" or develop nausea or vomiting, please contact the office immediately for further recommendations for treatment.   ITCHING:  If you experience itching with your medications, try taking only a single pain pill, or even half a pain pill at a time.  You can also use Benadryl over the counter for itching or also to help with sleep.   TED HOSE STOCKINGS:  Use stockings on both legs until for at least 2 weeks or as directed by physician office. They may be removed at night for sleeping.  MEDICATIONS:  See your medication summary on the After Visit Summary that nursing will review with you.  You may have some home medications which will be placed on hold until you complete the course of blood thinner medication.  It is important for you to complete the blood thinner medication as prescribed.  PRECAUTIONS:  If you experience chest pain or shortness of breath - call 911 immediately for transfer to the hospital emergency department.   If you develop a fever greater that 101 F, purulent drainage from wound, increased redness or drainage from wound, foul odor from the  wound/dressing, or calf pain - CONTACT YOUR SURGEON.                                                   FOLLOW-UP APPOINTMENTS:  If you do not already have a post-op appointment, please call the office for an appointment to be seen by your surgeon.  Guidelines for how soon to be seen are listed in your After Visit Summary, but are typically between 1-4 weeks after surgery.  OTHER INSTRUCTIONS:   Knee Replacement:  Do not place pillow under knee, focus on keeping the knee straight while resting. CPM instructions: 0-90 degrees, 2 hours in the morning, 2 hours in the afternoon, and 2 hours in the evening.   MAKE SURE YOU:   Understand these instructions.   Get help right away if you are not doing well or get worse.    Thank you for letting us be a part of your medical care team.  It is a privilege we respect greatly.  We hope these instructions will help you stay on track for a fast and full recovery!

## 2016-02-28 NOTE — Progress Notes (Signed)
Utilization review completed.  

## 2016-02-28 NOTE — Evaluation (Signed)
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    Clinical Impression  Patient evaluated by Physical Therapy with no further acute PT needs identified. All education has been completed and the patient has no further questions. Pt ambulated in hallway, practiced safe stair technique and performed LE exercises.  Pt reports pain much improved in knee post surgery and plans to d/c home later today. See below for any follow-up Physical Therapy or equipment needs. PT is signing off. Thank you for this referral.       Follow Up Recommendations Home health PT    Equipment Recommendations  None recommended by PT    Recommendations for Other Services       Precautions / Restrictions Precautions Precautions: Knee Restrictions Other Position/Activity Restrictions: WBAT      Mobility  Bed Mobility Overal bed mobility: Modified Independent                Transfers Overall transfer level: Needs assistance Equipment used: Rolling walker (2 wheeled) Transfers: Sit to/from Stand Sit to Stand: Min guard;Supervision         General transfer comment: verbal cues for safe technique  Ambulation/Gait Ambulation/Gait assistance: Min guard;Supervision Ambulation Distance (Feet): 320 Feet Assistive device: Rolling walker (2 wheeled) Gait Pattern/deviations: Step-through pattern;Antalgic     General Gait Details: verbal cues for sequence, step length, RW distance  Stairs Stairs: Yes Stairs assistance: Min guard Stair Management: Step to pattern;Backwards;With walker Number of Stairs: 2 General stair comments: verbal cues for sequence, safety, RW positioning, performed twice, pt reports understanding and need  for someone to hold RW steady  Wheelchair Mobility    Modified Rankin (Stroke Patients Only)       Balance                                             Pertinent Vitals/Pain Pain Assessment: 0-10 Pain Score: 3  Pain Location: L knee Pain Descriptors / Indicators: Sore Pain Intervention(s): Limited activity within patient's tolerance;Monitored during session;Repositioned;Premedicated before session;Ice applied    Home Living Family/patient expects to be discharged to:: Private residence Living Arrangements: Spouse/significant other Available Help at Discharge: Family;Available 24 hours/day Type of Home: House Home Access: Stairs to enter Entrance Stairs-Rails: None Entrance Stairs-Number of Steps: 3 Home Layout: One level Home Equipment: Crutches;Wheelchair - [employer] - 2 wheels;Bedside commode      Prior Function Level of Independence: Independent with assistive device(s)               Hand Dominance        Extremity/Trunk Assessment               Lower Extremity Assessment: LLE deficits/detail   LLE Deficits / Details: unable to perform SLR, L knee AAROM -10-75*     Communication   Communication: No difficulties  Cognition Arousal/Alertness: Awake/alert Behavior During Therapy: WFL for tasks assessed/performed Overall Cognitive Status: Within Functional Limits for tasks assessed                      General Comments  Exercises Total Joint Exercises Ankle Circles/Pumps: AROM;Both;5 reps Quad Sets: AROM;Left;10 reps Short Arc Quad: AROM;Left;10 reps Heel Slides: AAROM;Left;10 reps Hip ABduction/ADduction: AROM;Left;10 reps Straight Leg Raises: AAROM;Left;10 reps      Assessment/Plan    PT Assessment All further PT needs can be met in the next venue of care  PT Diagnosis Difficulty walking;Acute pain   PT Problem List Decreased mobility;Decreased strength;Decreased knowledge of precautions;Pain  PT  Treatment Interventions     PT Goals (Current goals can be found in the Care Plan section) Acute Rehab PT Goals PT Goal Formulation: All assessment and education complete, DC therapy    Frequency     Barriers to discharge        Co-evaluation               End of Session Equipment Utilized During Treatment: Gait belt Activity Tolerance: Patient tolerated treatment well Patient left: in bed;with call bell/phone within reach (back in bed for CPM before d/c) Nurse Communication: Mobility status         Time: 1540-0867 PT Time Calculation (min) (ACUTE ONLY): 20 min   Charges:   PT Evaluation $PT Eval Low Complexity: 1 Procedure     PT G Codes:        Talley Casco,KATHrine E 02/28/2016, 3:29 PM Carmelia Bake, PT, DPT 02/28/2016 Pager: [phone]

## 2016-02-28 NOTE — Care Management Note (Signed)
Case Management Note  Patient Details  Name: DUVAL MACLEOD MRN: [id number] Date of Birth: [birth year]  Subjective/Objective:                  Left knee primary OA / pain in the setting of prior septic knee Action/Plan: Discharge planning Expected Discharge Date:  02/28/16               Expected Discharge Plan:  [place]  In-House Referral:     Discharge planning Services  CM Consult  Post Acute Care Choice:  Home Health Choice offered to:  Patient  DME Arranged:  CPM DME Agency:  [employer]  HH Arranged:  PT HH Agency:  [place]  Status of Service:  Completed, signed off  Medicare Important Message Given:    Date Medicare IM Given:    Medicare IM give by:    Date Additional Medicare IM Given:    Additional Medicare Important Message give by:     If discussed at [place] of Stay Meetings, dates discussed:    Additional Comments: CM met with pt in room to offer choice of home health agency.  Pt chooses [facility] to render HHPT.  Referral given to [employer], Tim.  Cm called Ruby Cola of [place] to arrange for CPM and faxed facesheet, order, H&P to [phone].  NO other CM needs were communicated.  Dellie Catholic, RN 02/28/2016, 11:49 AM

## 2016-02-29 NOTE — Anesthesia Postprocedure Evaluation (Signed)
Anesthesia Post Note  Patient: Raymond CraverJeremy D Peterson  Procedure(s) Performed: Procedure(s) (LRB): LEFT TOTAL KNEE ARTHROPLASTY  (Left)  Patient location during evaluation: PACU Anesthesia Type: General (Initial SAB intraop but had failure to acheive satisfactory  anesthesia level . Gen anesth required) Level of consciousness: awake and alert Pain management: pain level controlled Vital Signs Assessment: post-procedure vital signs reviewed and stable Respiratory status: spontaneous breathing, nonlabored ventilation, respiratory function stable and patient connected to nasal cannula oxygen Cardiovascular status: blood pressure returned to baseline and stable Postop Assessment: no signs of nausea or vomiting Anesthetic complications: no    Last Vitals:  Filed Vitals:   02/28/16 1000 02/28/16 1446  BP: 125/65 132/62  Pulse: 77 73  Temp: 36.8 C 36.9 C  Resp: 17 17    Last Pain:  Filed Vitals:   02/28/16 1624  PainSc: 4                  Arnie Maiolo,JAMES TERRILL

## 2016-02-29 NOTE — Progress Notes (Signed)
     Subjective: 1 Day Post-Op Procedure(s) (LRB): LEFT TOTAL KNEE ARTHROPLASTY  (Left)   Seen by Dr. Charlann Boxerlin. Patient reports pain as mild, pain controlled.  No events throughout the night.  Dr. Charlann Boxerlin discussed the procedure as well as expectations of surgery. Ready to be discharged home if he does well with PT.  Objective:   VITALS:    02/28/16 1000  BP: 125/65  Pulse: 77  Temp: 98.2 F (36.8 C)  Resp: 17    Dorsiflexion/Plantar flexion intact Incision: dressing C/D/I No cellulitis present Compartment soft  LABS  Recent Labs  02/28/16 0434  HGB 13.9  HCT 40.0  WBC 10.3  PLT 212     Recent Labs  02/28/16 0434  NA 135  K 4.7  BUN 19  CREATININE 0.96  GLUCOSE 153*     Assessment/Plan: 1 Day Post-Op Procedure(s) (LRB): LEFT TOTAL KNEE ARTHROPLASTY  (Left) Foley cath d/c'ed Advance diet Up with therapy D/C IV fluids Discharge home with home health  [facility]. Follow up with OLIN,Marolyn Urschel D in 2 weeks.  Contact information:  [facility] [address], [address] [place] [id number] [phone]    Obese (BMI 30-39.9) Estimated body mass index is 34.86 kg/(m^2) as calculated from the following:   Height as of this encounter: 5\' 11"  (1.803 m).   Weight as of this encounter: 113.314 kg (249 lb 13 oz). Patient also counseled that weight may inhibit the healing process Patient counseled that losing weight will help with future health issues       Anastasio AuerbachMatthew S. Travez Stancil   PAC  02/29/2016, 9:14 AM

## 2016-03-01 NOTE — Op Note (Signed)
NAMELOCKLAN, CANOY NO.:  [id number]  MEDICAL RECORD NO.:  [id number]  LOCATION:  1619                         FACILITY:  [facility]  PHYSICIAN:  Madlyn Frankel. Charlann Boxer, M.D.  DATE OF BIRTH:  04-11-1971  DATE OF PROCEDURE:  02/27/2016 DATE OF DISCHARGE:  02/28/2016                              OPERATIVE REPORT   PREOPERATIVE DIAGNOSIS:  Left knee progressive degenerative joint changes in setting of previous septic knee arthritis.  POSTOPERATIVE DIAGNOSIS:  Left knee progressive degenerative joint changes in setting of previous septic knee arthritis.  FINDINGS:  The patient was noted to have an extremely arthrofibrotic knee with significant scar tissue inside the knee with limited range of motion noted at the time of this anesthesia 15-70 degrees.  PROCEDURE:  Left total knee arthroplasty using DePuy and Attune knee components with a size 6 femur, 7 tibial base plate, a size 7 mm posterior stabilized AOX insert and a 38 anatomic patella button.  SURGEON:  Madlyn Frankel. Charlann Boxer, MD  ASSISTANT:  Lanney Gins, PA-C.  Note that Mr. Carmon Sails was present for the entirety of the case from preoperative positioning, perioperative management, operative extremity, general facilitation of the case and primary wound closure.  ANESTHESIA:  Spinal.  SPECIMENS:  None.  COMPLICATION:  None.  TOURNIQUET TIME:  51 minutes at 250 mmHg.  INDICATION OF PROCEDURE:  Jerimie is a 45 year old male with an unfortunate progression of left knee osteoarthritis.  This was complicated by a septic transition of a Staphylococcus bacteremia to his left knee joint.  He had a history of left shoulder rotator cuff repair with necessity for revision surgery and complicated by infection.  He got sick and then developed left knee pain.  He had his left knee treated with an I and D washout as well as antibiotics and was seen in the office for progressive loss of function, motion, and a significant reduction  in overall quality of life.  He had labs that indicated a complete resolution of any infections, body with normalization of the sedimentation rate and C-reactive protein.  He did have pain that affected overall quality life and desired to return back to some normal semblance of an existence for him which was quite active.  Risks of infection discussed and stressed risks of the postoperative arthrofibrosis based on his preoperative range of motion, standard risks of DVT, component failure, need for future revision surgery at 45 years of age were all discussed reviewed.  Consent was obtained.  PROCEDURE IN DETAIL:  The patient was brought to the operative theater. Once adequate anesthesia, preoperative antibiotics, Ancef administered, he was positioned supine with a left thigh tourniquet placed.  The left lower extremity was then prepped and draped in sterile fashion with a left leg in a DeMayo leg holder.  The time-out was performed identifying the patient, planned procedure, and extremity.  Leg was exsanguinated. Tourniquet elevated to 250 mmHg.  At this point, I made a midline incision.  Soft tissue planes created. I then created a medial parapatellar arthrotomy encountering clear synovial fluid, but it was also extremely evident this significant arthrofibrotic scarring in the entire articular surface.  The patella was basically non-mobile and fixed  related to scar.  At this point, a probably a good 10-15 minutes were needed for exposure purposes alone maintaining the integrity of the extensor mechanism recreating the medial and lateral gutters and suprapatellar pouch.  I was eventually able to get the knee exposed in extension in order to evert the patella.  I then focused on patella resection.  The pre-cup measured was noted to be about 25 mm.  I resected down to 14 and used a 38 patellar button to restore the height.  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  Lug holes were drilled and a metal Shim was placed to protect the patella from any retractors and saw blades.  At this point, the knee was able to be flexed.  This was done not easily, but I was lysing adhesions as well as scarred down the anterior cruciate ligament tissues well.  With the knee flexed, I then opened up the distal femur of the drill, irrigated, tried to prevent fat emboli, I then placed an intramedullary rod.  A 5 degrees of valgus 11 mm of bone was resected off the distal femur based on his preoperative flexion contracture.  Again noting significantly soft bone.  At this point, based on the exposuring the proximal medial peel, I was able to subluxate the tibia carefully.  Using extramedullary guide, I resected 2 mm off the proximal medial tibia.  We then confirmed that extension gap would be stable with at least a 5 mm insert.  At this point, I confirmed that the cut of the tibia was perpendicular in the coronal plane using the tibial tray and alignment rod.  We then sized the femur to be a size 6 standard.  The size 6 rotation block was then pinned into place using the C-clamp based on the proximal tibial cut.  At this point, the 4 in 1 cutting block was pinned into the leg position.  Anterior, posterior, and chamfer cuts were carefully made without complication.  Final box cut was made off the base of the lateral aspect of distal femur to the size 6 femur.  At this point, I re-subluxated the tibia anteriorly.  The cut surface in the best fit was size 7 tibial tray.  The tibial tray was pinned into position through the medial third of the tubercle, drilled and keel punch for the size 7 tray.  At this point, trial reduction was carried out with a size 6 femur, 7 tibial tray.  We initially used a 6 then went up to a 7 mm insert, finally the knee came to full extension and flexion with the patella tracking  through the trochlea without application pressure.  It was noted that knee came to full extension and I was able to flex him fully with the knee open to 120 degrees with well tracking patella.  At this point, we removed the trial components.  We injected the synovial capsule junction knee with 0.25% Marcaine with epinephrine, 1 mL of Toradol and 30 mL of normal saline for total of 61 mL.  This was done in the synovial capsule junction. The knee was then irrigated with normal saline solution.  His final components were opened and gentamicin impregnated cement was opened. The final components were then cemented into place.  The knee was brought to extension until cement cured.  The excessive cement was removed throughout the knee.  At this point, I elected to sprinkle  about half of a g of vancomycin into the intra- articular surface of the knee joint.  We then reapproximated extensor mechanism using #1 Vicryl and 0 V-Loc suture.  I then sprinkled the remaining 500 mg of vancomycin powder into the superficial aspect of the wound and close the remaining wound with 2-0 Vicryl and running 3-0 Monocryl.  The knee was cleaned, dried, and dressed sterilely with Dermabond and Aquacel dressing.  Patient was brought to the recovery room in stable condition tolerating the procedure well.  In the postoperative period based on his significant preoperative arthrofibrosis, I am going to plan on having him use a CPM to see if this will help move and improve his overall movement.  We will see him back in routine followup in the office in 2 weeks to check his progress, initiate physical therapy as soon as possible to maximize his overall function return.  His recovery will be extremely lengthy to work on motion followed by strengthening and overall quality improvement.     Madlyn Frankel Charlann Boxer, M.D.     MDO/MEDQ  D:  03/01/2016  T:  03/01/2016  Job:  [id number]

## 2016-03-02 NOTE — Discharge Summary (Signed)
Physician Discharge Summary  Patient ID: Raymond Peterson MRN: [id number] DOB/AGE: 07/03/1971 45 y.o.  Admit date: 02/27/2016 Discharge date: 02/28/2016   Procedures:  Procedure(s) (LRB): LEFT TOTAL KNEE ARTHROPLASTY  (Left)  Attending Physician:  Dr. Durene Romans   Admission Diagnoses:   Left knee primary OA / pain in the setting of prior septic knee  Discharge Diagnoses:  Principal Problem:   S/P left TKA Active Problems:   S/P knee replacement  Past Medical History  Diagnosis Date  . PONV (postoperative nausea and vomiting)     "just once"  . Arthritis     oa  kness & shoulders   . DVT (deep venous thrombosis) (HCC)     HPI:    Raymond Peterson, 45 y.o. male, has a history of pain and functional disability in the left knee due to arthritis and previously septic knee and has failed non-surgical conservative treatments for greater than 12 weeks to include NSAID's and/or analgesics, use of assistive devices and activity modification. Onset of symptoms was abrupt, starting in October 2016 with rapidlly worsening course since that time. In October he was diagnosised with a left shoulder and left knee infection. Patient states that these were take care of by Dr. Rennis Chris, who referred the patient to Dr. Charlann Boxer with regards to the knee. The patient noted prior procedures on the knee to include arthroscopy on the left knee(s). Patient currently rates pain in the left knee(s) at 10 out of 10 with activity. Patient has night pain, worsening of pain with activity and weight bearing, pain that interferes with activities of daily living, pain with passive range of motion, crepitus and joint swelling. Patient has evidence of periarticular osteophytes and joint space narrowing by imaging studies. There is no active infection. Risks, benefits and expectations were discussed with the patient. Risks including but not limited to the risk of anesthesia, blood clots, nerve damage, blood vessel  damage, failure of the prosthesis, infection and up to and including death. Patient understand the risks, benefits and expectations and wishes to proceed with surgery. We have reviewed with the patient as well as his family about the possibility of having to place an antibiotic spacer. It would not be the expected surgery, but they do know that it's a possibility and the expectations and course if that was to happen.  PCP: Pcp Not In System   Discharged Condition: good  Hospital Course:  Patient underwent the above stated procedure on 02/27/2016. Patient tolerated the procedure well and brought to the recovery room in good condition and subsequently to the floor.  POD #1 BP: 125/65 ; Pulse: 77 ; Temp: 98.2 F (36.8 C) ; Resp: 17 Patient reports pain as mild, pain controlled. No events throughout the night. Dr. Charlann Boxer discussed the procedure as well as expectations of surgery. Ready to be discharged home. Dorsiflexion/plantar flexion intact, incision: dressing C/D/I, no cellulitis present and compartment soft.   LABS  Basename    HGB     13.9  HCT     40.0    Discharge Exam: General appearance: alert, cooperative and no distress Extremities: Homans sign is negative, no sign of DVT, no edema, redness or tenderness in the calves or thighs and no ulcers, gangrene or trophic changes  Disposition: Home with follow up in 2 weeks   Follow-up Information    Follow up with Shelda Pal, MD. Schedule an appointment as soon as possible for a visit in 2 weeks.   Specialty:  Orthopedic  Surgery   Contact information:   [address] [place] [address] [phone]       [facility]   Why:  home health physical therapy   Contact information:   [address] [place] [address] [phone]       Follow up with [facility].   Why:  CPM (continuous passive motion device)   Contact information:   [phone]      Discharge  Instructions    Call MD / Call 911    Complete by:  As directed   If you experience chest pain or shortness of breath, CALL 911 and be transported to the hospital emergency room.  If you develope a fever above 101 F, pus (white drainage) or increased drainage or redness at the wound, or calf pain, call your surgeon's office.     Change dressing    Complete by:  As directed   Maintain surgical dressing until follow up in the clinic. If the edges start to pull up, may reinforce with tape. If the dressing is no longer working, may remove and cover with gauze and tape, but must keep the area dry and clean.  Call with any questions or concerns.     Constipation Prevention    Complete by:  As directed   Drink plenty of fluids.  Prune juice may be helpful.  You may use a stool softener, such as Colace (over the counter) 100 mg twice a day.  Use MiraLax (over the counter) for constipation as needed.     Diet - low sodium heart healthy    Complete by:  As directed      Discharge instructions    Complete by:  As directed   Maintain surgical dressing until follow up in the clinic. If the edges start to pull up, may reinforce with tape. If the dressing is no longer working, may remove and cover with gauze and tape, but must keep the area dry and clean.  [facility]. Call with any questions or concerns.     Increase activity slowly as tolerated    Complete by:  As directed   Weight bearing as tolerated with assist device (walker, cane, etc) as directed, use it as long as suggested by your surgeon or therapist, typically at least 4-6 weeks.     TED hose    Complete by:  As directed   Use stockings (TED hose) for 2 weeks on both leg(s).  You may remove them at night for sleeping.             Medication List    STOP taking these medications        HYDROcodone-acetaminophen 5-325 MG tablet  Commonly known as:  NORCO/VICODIN  Replaced by:  HYDROcodone-acetaminophen  7.5-325 MG tablet     zolpidem 10 MG tablet  Commonly known as:  AMBIEN      TAKE these medications        aspirin EC 325 MG tablet  Take 1 tablet (325 mg total) by mouth 2 (two) times daily. Take for 4 weeks.  Start taking on:  03/14/2016     celecoxib 200 MG capsule  Commonly known as:  CELEBREX  Take 1 capsule (200 mg total) by mouth every 12 (twelve) hours.     docusate sodium 100 MG capsule  Commonly known as:  COLACE  Take 1 capsule (100 mg total) by mouth 2 (  two) times daily.     ferrous sulfate 325 (65 FE) MG tablet  Take 1 tablet (325 mg total) by mouth 3 (three) times daily after meals.     HYDROcodone-acetaminophen 7.5-325 MG tablet  Commonly known as:  NORCO  Take 1-2 tablets by mouth every 4 (four) hours as needed for moderate pain.     methocarbamol 500 MG tablet  Commonly known as:  ROBAXIN  Take 1 tablet (500 mg total) by mouth every 6 (six) hours as needed for muscle spasms.     OVER THE COUNTER MEDICATION  Take 1 capsule by mouth daily. Stimerex-ES (Extra Strength with 25mg  Ephedra Extract)     polyethylene glycol packet  Commonly known as:  MIRALAX / GLYCOLAX  Take 17 g by mouth 2 (two) times daily.     rivaroxaban 10 MG Tabs tablet  Commonly known as:  XARELTO  Take 1 tablet (10 mg total) by mouth daily.     testosterone 50 MG/5GM (1%) Gel  Commonly known as:  ANDROGEL  Place 5 g onto the skin daily.         Signed: Anastasio Auerbach. Ilyanna Baillargeon   PA-C  03/02/2016, 8:14 AM

## 2016-03-06 ENCOUNTER — Encounter (HOSPITAL_COMMUNITY): Payer: BLUE CROSS/BLUE SHIELD

## 2016-03-06 ENCOUNTER — Ambulatory Visit (HOSPITAL_COMMUNITY)
Admission: RE | Admit: 2016-03-06 | Discharge: 2016-03-06 | Disposition: A | Discharge status: Home / Self Care | Payer: BLUE CROSS/BLUE SHIELD | Source: Ambulatory Visit | Attending: Orthopedic Surgery | Admitting: Orthopedic Surgery

## 2016-03-06 ENCOUNTER — Other Ambulatory Visit (HOSPITAL_COMMUNITY): Payer: Self-pay | Admitting: Physician Assistant

## 2016-03-06 DIAGNOSIS — R52 Pain, unspecified: Secondary | ICD-10-CM | POA: Diagnosis present

## 2016-03-06 NOTE — Progress Notes (Addendum)
*  PRELIMINARY RESULTS* Vascular Ultrasound Left lower extremity venous duplex has been completed.  Preliminary findings: no obvious evidence of DVT. Anechoic area noted in the proximal left calf measuring 5 cm. This appears to be muscle tear vs hematoma vs unknown etiology.   Attempted call report to Dr. Charlann Boxerlin. Waited on line for 10 min without any answer. Chose option to have return call when my turn comes up. 11:56 am. Will have patient wait until I speak with someone in office.  12:10 pm Still have not received call back. Attempted call to office again. Pt only able to wait for 5 more minutes. Will let pt leave and leave another message for office to just call pt directly with any instructions.    Farrel DemarkJill Eunice, RDMS, RVT  03/06/2016, 11:49 AM

## 2016-06-14 ENCOUNTER — Ambulatory Visit (HOSPITAL_COMMUNITY)
Admission: RE | Admit: 2016-06-14 | Discharge: 2016-06-14 | Disposition: A | Discharge status: Home / Self Care | Payer: Self-pay | Source: Ambulatory Visit | Attending: Cardiovascular Disease | Admitting: Cardiovascular Disease

## 2016-06-14 ENCOUNTER — Other Ambulatory Visit (HOSPITAL_COMMUNITY): Payer: Self-pay | Admitting: Orthopedic Surgery

## 2016-06-14 DIAGNOSIS — M79605 Pain in left leg: Secondary | ICD-10-CM

## 2016-06-14 DIAGNOSIS — M7989 Other specified soft tissue disorders: Secondary | ICD-10-CM

## 2017-09-28 IMAGING — CR DG CHEST 1V PORT
1 series · 1 of 1 positions shown · non-contrast
Comparison: None.

CLINICAL DATA: Right PICC line insertion, left knee infection,
access for long-term antibiotics

EXAM:
PORTABLE CHEST 1 VIEW

[AP]
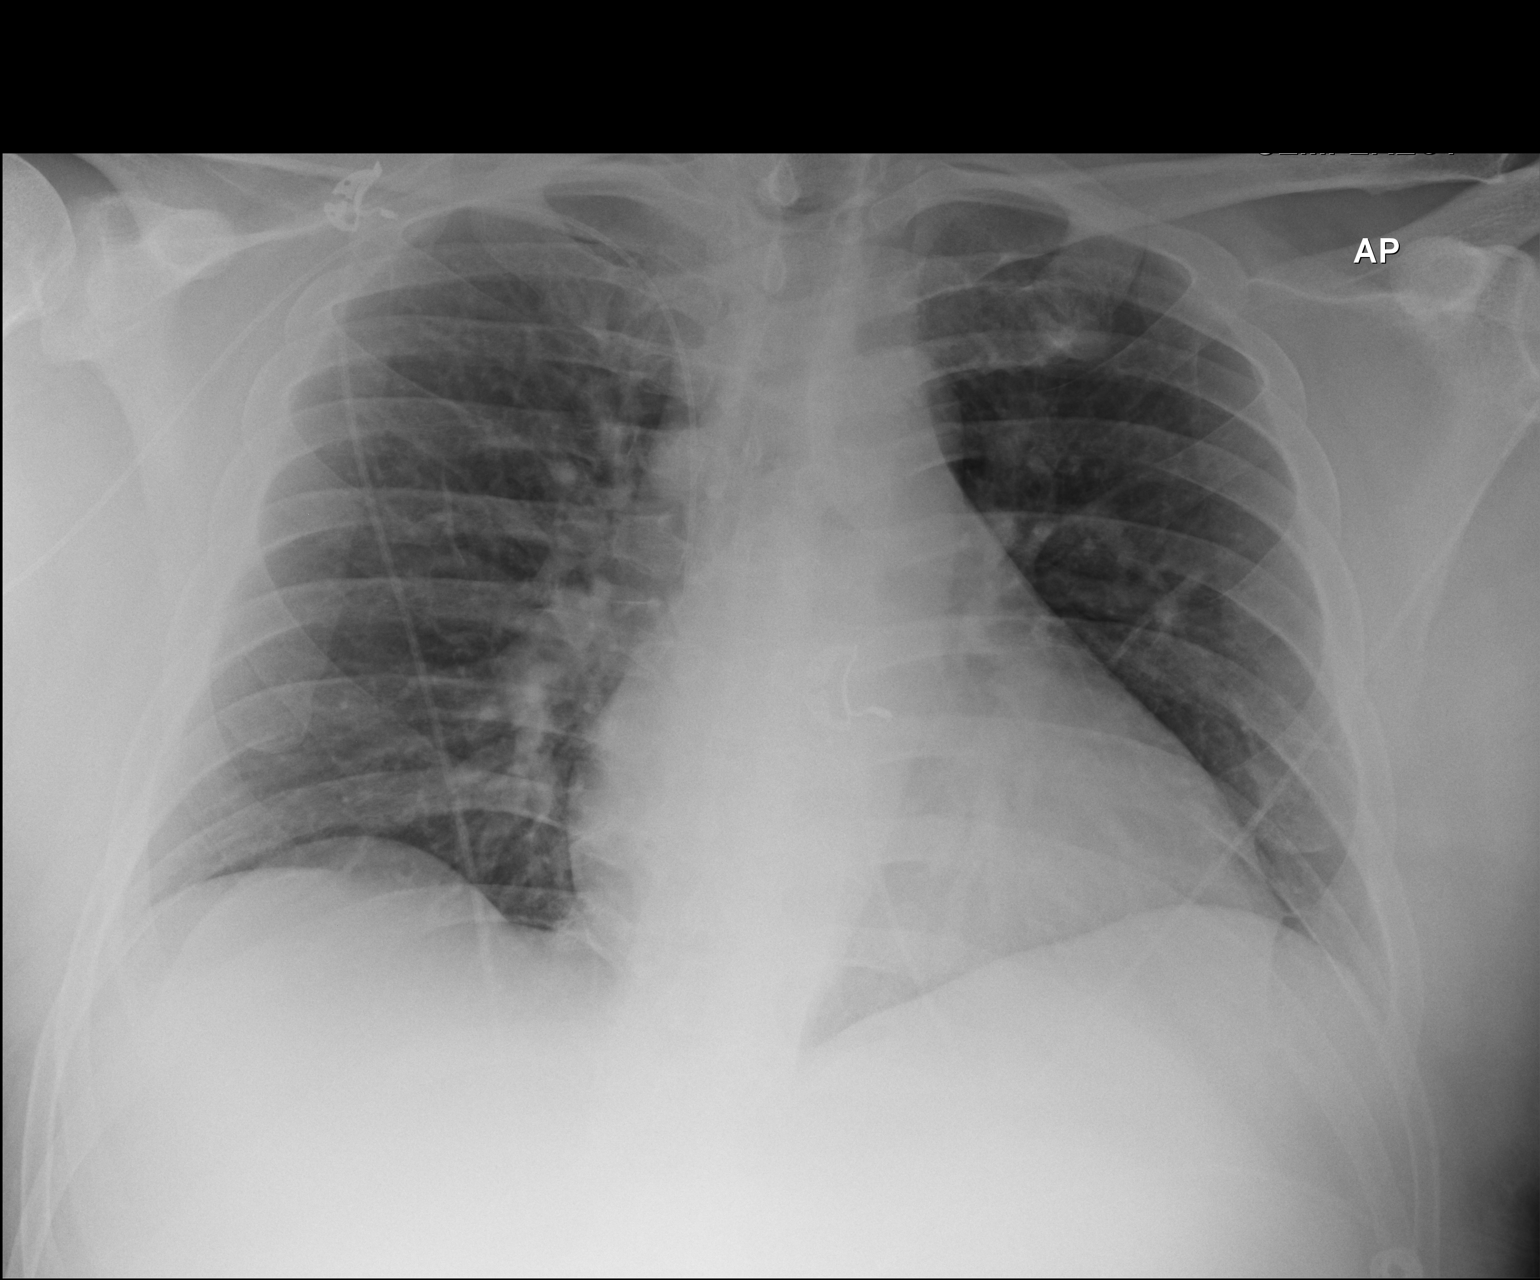

[1 of 1 positions shown; findings below may reference images not displayed]

FINDINGS: Right PICC line mid SVC level. Mild cardiac enlargement and
prominent hilar vascularity. Minimal basilar atelectasis. No focal
pneumonia, collapse or consolidation. No edema, effusion or
pneumothorax. Trachea midline. No acute osseous finding. Monitor
leads overlie the chest.
IMPRESSION: Right PICC line tip mid SVC level.

Cardiomegaly without CHF

Basilar atelectasis

## 2017-10-05 IMAGING — CT CT ANGIO CHEST
2 of 6 series · 18 of 36 positions shown · IV contrast (Omni 300)
Comparison: Chest radiograph performed 08/12/2015

CLINICAL DATA: Status post left knee and left shoulder surgery,
with worsening pain, swelling and fever at the left leg. Diagnosed
with left leg DVT. Assess for pulmonary embolus. Initial encounter.

EXAM:
CT ANGIOGRAPHY CHEST WITH CONTRAST
TECHNIQUE: Multidetector CT imaging of the chest was performed using the
standard protocol during bolus administration of intravenous
contrast. Multiplanar CT image reconstructions and MIPs were
obtained to evaluate the vascular anatomy.
CONTRAST:  100mL OMNIPAQUE IOHEXOL 350 MG/ML SOLN

[Series 6: pe thins · axial · 0.82mm/px · z∈[+1192,+1442]mm · 17 of 555 slices shown]
[im 27/555  lung]
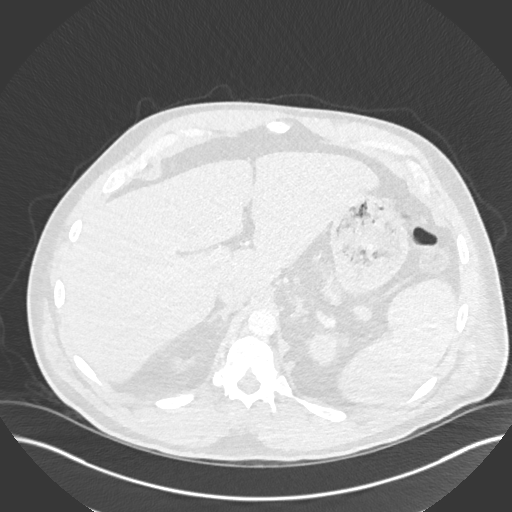
[im 53/555  mediastinal]
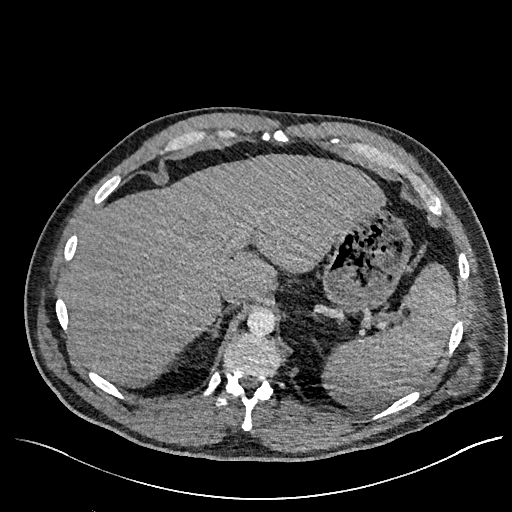
[im 80/555  lung]
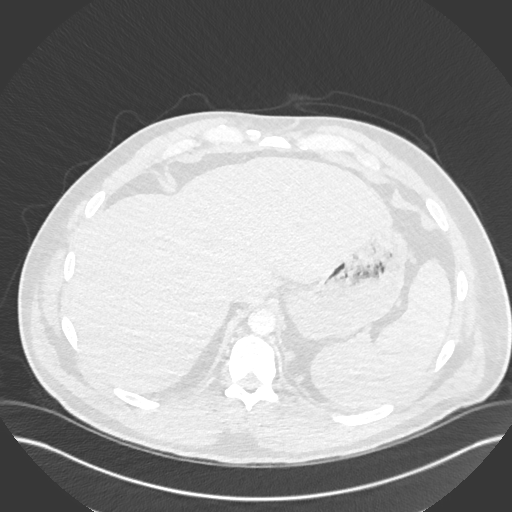
[im 132/555  mediastinal]
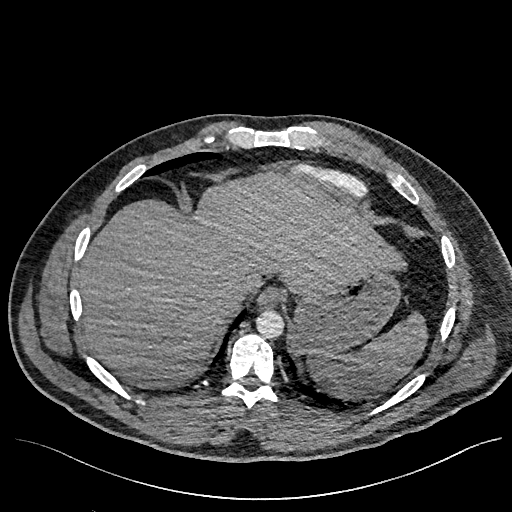
[im 159/555  lung]
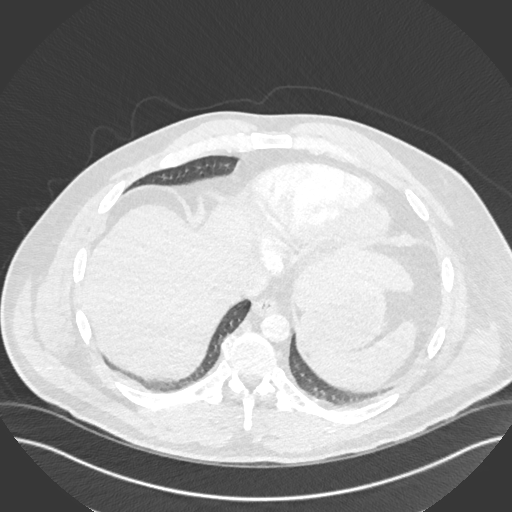
[im 185/555  mediastinal]
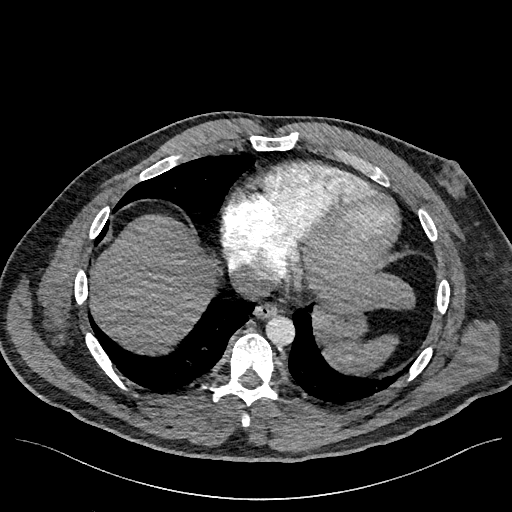
[im 212/555  lung]
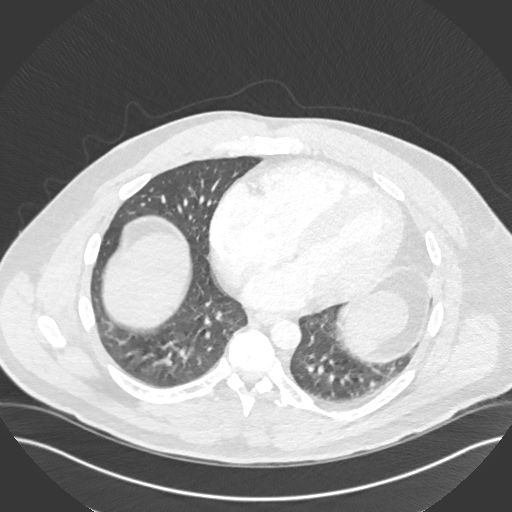
[im 238/555  mediastinal]
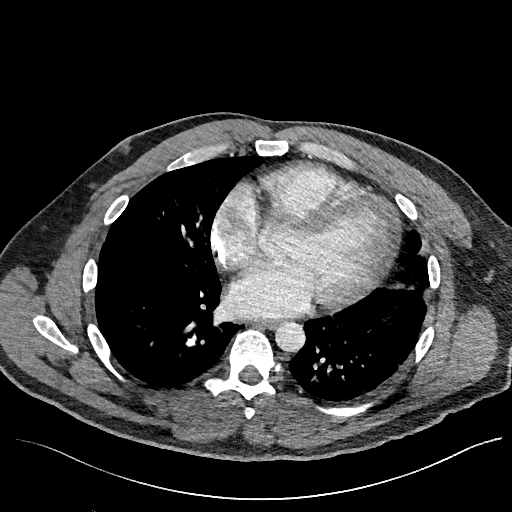
[im 291/555  lung]
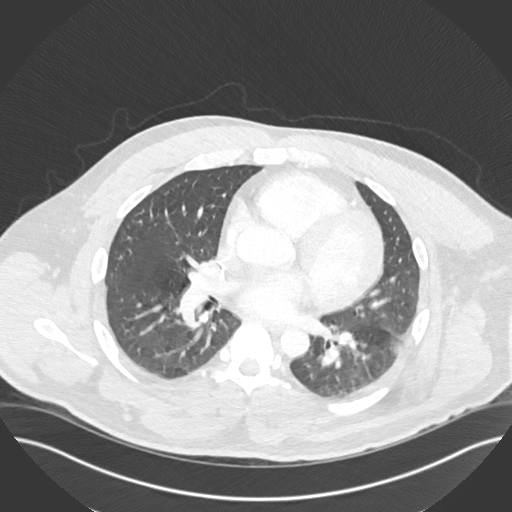
[im 317/555  mediastinal]
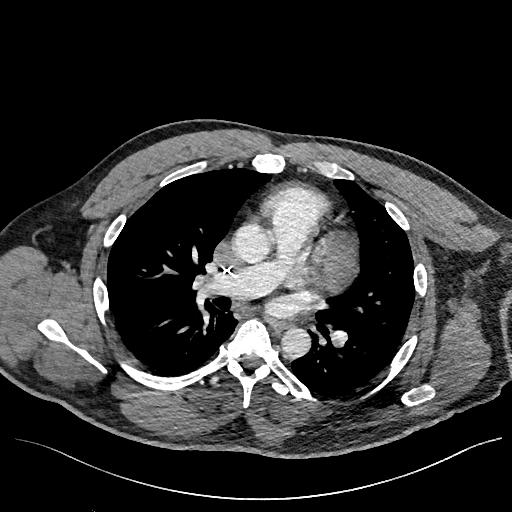
[im 343/555  lung]
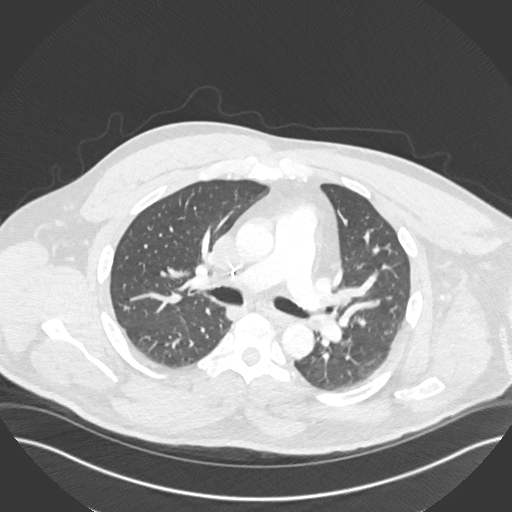
[im 370/555  mediastinal]
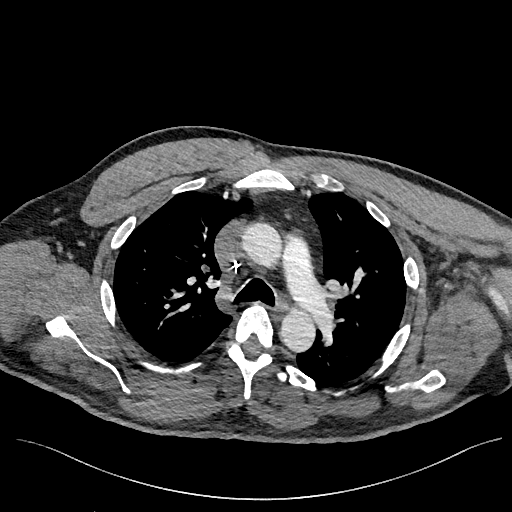
[im 396/555  lung]
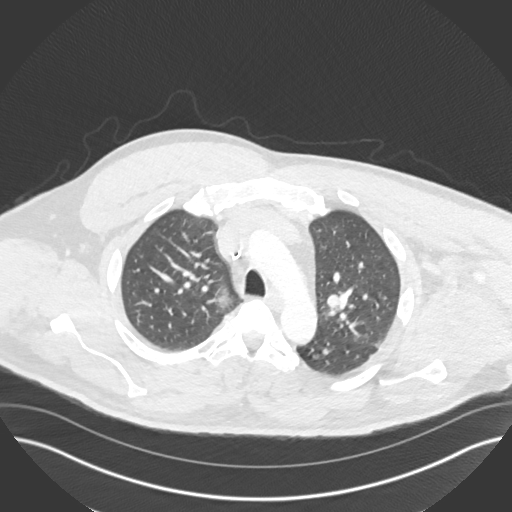
[im 423/555  mediastinal]
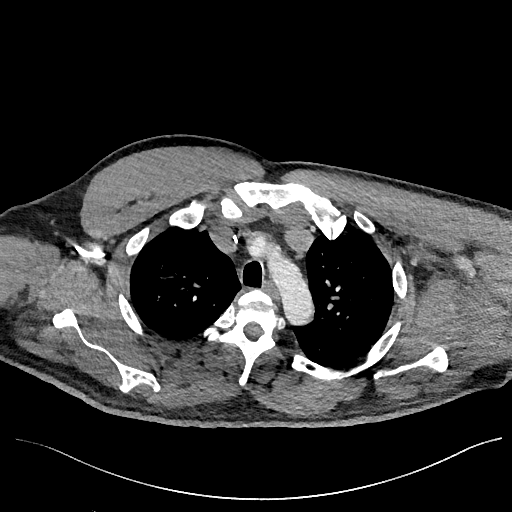
[im 475/555  lung]
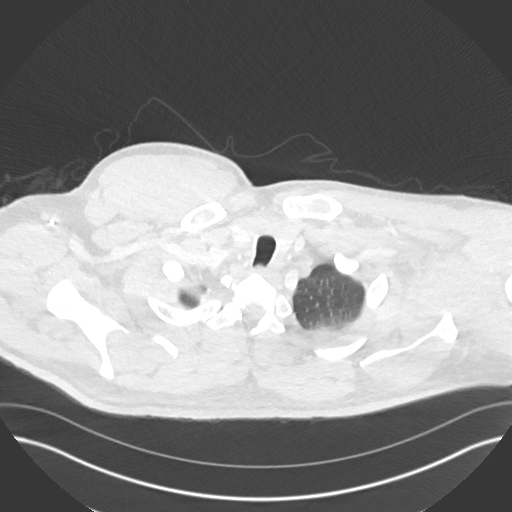
[im 502/555  mediastinal]
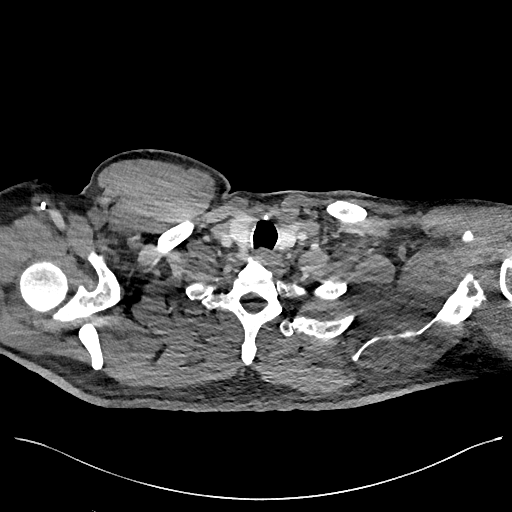
[im 528/555  lung]
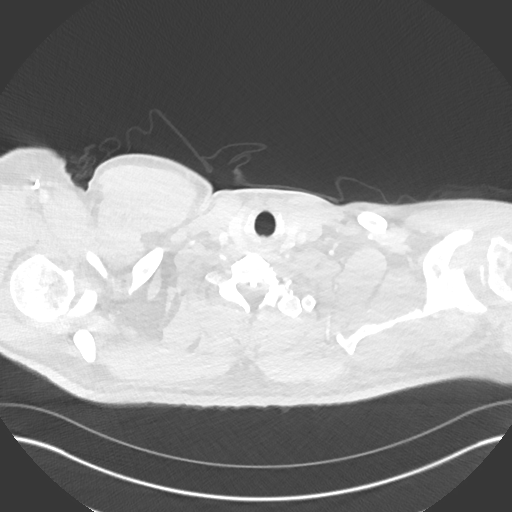

[Series 7: pe 2mm cor · coronal · 0.54mm/px · 1 of 130 slices shown]
[im 65/130  mediastinal]
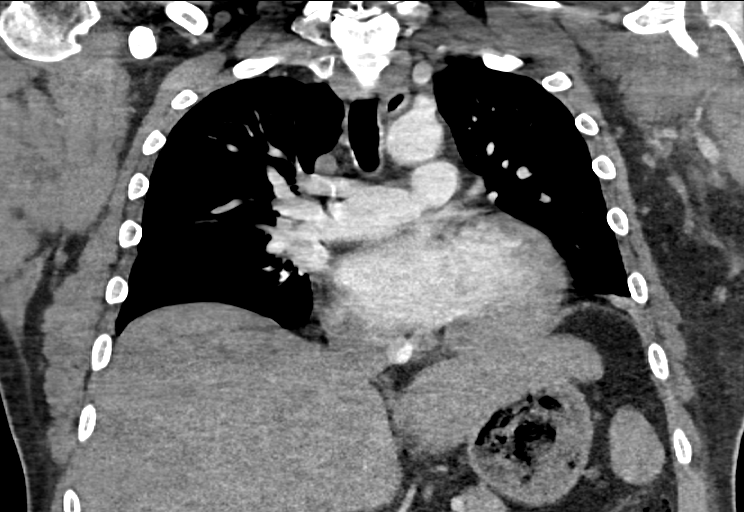

[18 of 36 positions shown; findings below may reference images not displayed]

FINDINGS: There is no evidence of central pulmonary embolus. However, there
are a number of potential vague filling defects in the periphery of
the pulmonary arteries bilaterally. This may simply reflect motion
artifact or beam hardening artifact, though segmental pulmonary
embolus cannot be excluded on the basis of this study.

Minimal left-sided atelectasis is noted. The lungs are otherwise
clear. There is no evidence of significant focal consolidation,
pleural effusion or pneumothorax. No masses are identified; no
abnormal focal contrast enhancement is seen.

The mediastinum is unremarkable in appearance. No mediastinal
lymphadenopathy is seen. No pericardial effusion is identified. The
great vessels are grossly unremarkable in appearance. No axillary
lymphadenopathy is seen. The visualized portions of the thyroid
gland are unremarkable in appearance.

The visualized portions of the liver and spleen are unremarkable.

No acute osseous abnormalities are seen.

Review of the MIP images confirms the above findings.
IMPRESSION: 1. No evidence of central pulmonary embolus. However, there are
number of potential vague filling defects in the periphery of the
pulmonary arteries bilaterally; these may simply be artifactual,
though segmental pulmonary embolus cannot be excluded on the basis
of this study. Depending on the patient's symptoms, a repeat CTA of
the chest could be considered. Note that a new peripheral IV
catheter would be required; the patient's existing power PICC
limited the rate of injection of contrast on this study,
corresponding to the suboptimal results.
2. Minimal left-sided atelectasis noted.  Lungs otherwise clear.
